# Patient Record
Sex: Male | Born: 1955 | Race: Black or African American | Hispanic: No | Smoking: Never smoker
Health system: Southern US, Community
[De-identification: ages and names within clinical notes are randomized; demographics above are authoritative.]

---

## 2019-08-24 ENCOUNTER — Inpatient Hospital Stay (HOSPITAL_COMMUNITY)

## 2019-08-24 ENCOUNTER — Inpatient Hospital Stay (HOSPITAL_COMMUNITY)
Admission: AD | Admit: 2019-08-24 | Discharge: 2019-08-29 | DRG: 177 | Disposition: A | Discharge status: Other Acute Inpatient Hospital | Source: Other Acute Inpatient Hospital | Attending: Internal Medicine | Admitting: Internal Medicine

## 2019-08-24 DIAGNOSIS — I252 Old myocardial infarction: Secondary | ICD-10-CM | POA: Diagnosis not present

## 2019-08-24 DIAGNOSIS — G40909 Epilepsy, unspecified, not intractable, without status epilepticus: Secondary | ICD-10-CM | POA: Diagnosis present

## 2019-08-24 DIAGNOSIS — R0602 Shortness of breath: Secondary | ICD-10-CM | POA: Diagnosis not present

## 2019-08-24 DIAGNOSIS — I10 Essential (primary) hypertension: Secondary | ICD-10-CM | POA: Diagnosis present

## 2019-08-24 DIAGNOSIS — I4901 Ventricular fibrillation: Secondary | ICD-10-CM | POA: Diagnosis present

## 2019-08-24 DIAGNOSIS — E1169 Type 2 diabetes mellitus with other specified complication: Secondary | ICD-10-CM

## 2019-08-24 DIAGNOSIS — R509 Fever, unspecified: Secondary | ICD-10-CM | POA: Diagnosis present

## 2019-08-24 DIAGNOSIS — G931 Anoxic brain damage, not elsewhere classified: Secondary | ICD-10-CM | POA: Diagnosis present

## 2019-08-24 DIAGNOSIS — D509 Iron deficiency anemia, unspecified: Secondary | ICD-10-CM | POA: Diagnosis present

## 2019-08-24 DIAGNOSIS — B9689 Other specified bacterial agents as the cause of diseases classified elsewhere: Secondary | ICD-10-CM | POA: Diagnosis present

## 2019-08-24 DIAGNOSIS — F29 Unspecified psychosis not due to a substance or known physiological condition: Secondary | ICD-10-CM | POA: Diagnosis present

## 2019-08-24 DIAGNOSIS — H548 Legal blindness, as defined in USA: Secondary | ICD-10-CM | POA: Diagnosis present

## 2019-08-24 DIAGNOSIS — U071 COVID-19: Secondary | ICD-10-CM | POA: Diagnosis present

## 2019-08-24 DIAGNOSIS — J9601 Acute respiratory failure with hypoxia: Secondary | ICD-10-CM

## 2019-08-24 DIAGNOSIS — Z87440 Personal history of urinary (tract) infections: Secondary | ICD-10-CM | POA: Diagnosis not present

## 2019-08-24 DIAGNOSIS — N179 Acute kidney failure, unspecified: Secondary | ICD-10-CM | POA: Diagnosis present

## 2019-08-24 DIAGNOSIS — E119 Type 2 diabetes mellitus without complications: Secondary | ICD-10-CM

## 2019-08-24 DIAGNOSIS — K567 Ileus, unspecified: Secondary | ICD-10-CM | POA: Diagnosis present

## 2019-08-24 DIAGNOSIS — J9811 Atelectasis: Secondary | ICD-10-CM | POA: Diagnosis present

## 2019-08-24 DIAGNOSIS — B3749 Other urogenital candidiasis: Secondary | ICD-10-CM | POA: Diagnosis present

## 2019-08-24 DIAGNOSIS — Z1612 Extended spectrum beta lactamase (ESBL) resistance: Secondary | ICD-10-CM | POA: Diagnosis present

## 2019-08-24 DIAGNOSIS — Z79899 Other long term (current) drug therapy: Secondary | ICD-10-CM

## 2019-08-24 DIAGNOSIS — Z8673 Personal history of transient ischemic attack (TIA), and cerebral infarction without residual deficits: Secondary | ICD-10-CM

## 2019-08-24 DIAGNOSIS — Z7401 Bed confinement status: Secondary | ICD-10-CM

## 2019-08-24 DIAGNOSIS — I2510 Atherosclerotic heart disease of native coronary artery without angina pectoris: Secondary | ICD-10-CM | POA: Diagnosis present

## 2019-08-24 DIAGNOSIS — Z794 Long term (current) use of insulin: Secondary | ICD-10-CM

## 2019-08-24 LAB — COMPREHENSIVE METABOLIC PANEL
ALT: 20 U/L (ref 0–44)
AST: 55 U/L — ABNORMAL HIGH (ref 15–41)
Albumin: 2.6 g/dL — ABNORMAL LOW (ref 3.5–5.0)
Alkaline Phosphatase: 107 U/L (ref 38–126)
Anion gap: 10 (ref 5–15)
BUN: 28 mg/dL — ABNORMAL HIGH (ref 8–23)
CO2: 23 mmol/L (ref 22–32)
Calcium: 8.5 mg/dL — ABNORMAL LOW (ref 8.9–10.3)
Chloride: 102 mmol/L (ref 98–111)
Creatinine, Ser: 2.38 mg/dL — ABNORMAL HIGH (ref 0.61–1.24)
GFR calc Af Amer: 32 mL/min — ABNORMAL LOW (ref >60)
GFR calc non Af Amer: 28 mL/min — ABNORMAL LOW (ref >60)
Glucose, Bld: 113 mg/dL — ABNORMAL HIGH (ref 70–99)
Potassium: 5 mmol/L (ref 3.5–5.1)
Sodium: 135 mmol/L (ref 135–145)
Total Bilirubin: 0.4 mg/dL (ref 0.3–1.2)
Total Protein: 6 g/dL — ABNORMAL LOW (ref 6.5–8.1)

## 2019-08-24 LAB — BRAIN NATRIURETIC PEPTIDE: B Natriuretic Peptide: 45.2 pg/mL (ref 0.0–100.0)

## 2019-08-24 LAB — CBC WITH DIFFERENTIAL/PLATELET
Abs Immature Granulocytes: 0.04 10*3/uL (ref 0.00–0.07)
Basophils Absolute: 0 10*3/uL (ref 0.0–0.1)
Basophils Relative: 0 %
Eosinophils Absolute: 0 10*3/uL (ref 0.0–0.5)
Eosinophils Relative: 0 %
HCT: 27.2 % — ABNORMAL LOW (ref 39.0–52.0)
Hemoglobin: 8.2 g/dL — ABNORMAL LOW (ref 13.0–17.0)
Immature Granulocytes: 1 %
Lymphocytes Relative: 7 %
Lymphs Abs: 0.6 10*3/uL — ABNORMAL LOW (ref 0.7–4.0)
MCH: 21.5 pg — ABNORMAL LOW (ref 26.0–34.0)
MCHC: 30.1 g/dL (ref 30.0–36.0)
MCV: 71.4 fL — ABNORMAL LOW (ref 80.0–100.0)
Monocytes Absolute: 0.4 10*3/uL (ref 0.1–1.0)
Monocytes Relative: 5 %
Neutro Abs: 7 10*3/uL (ref 1.7–7.7)
Neutrophils Relative %: 87 %
Platelets: 329 10*3/uL (ref 150–400)
RBC: 3.81 MIL/uL — ABNORMAL LOW (ref 4.22–5.81)
RDW: 19.3 % — ABNORMAL HIGH (ref 11.5–15.5)
WBC: 8 10*3/uL (ref 4.0–10.5)
nRBC: 0 % (ref 0.0–0.2)

## 2019-08-24 LAB — HIV ANTIBODY (ROUTINE TESTING W REFLEX): HIV Screen 4th Generation wRfx: NONREACTIVE

## 2019-08-24 LAB — GLUCOSE, CAPILLARY
Glucose-Capillary: 97 mg/dL (ref 70–99)
Glucose-Capillary: 153 mg/dL — ABNORMAL HIGH (ref 70–99)

## 2019-08-24 LAB — HEMOGLOBIN A1C
Hgb A1c MFr Bld: 5.6 % (ref 4.8–5.6)
Mean Plasma Glucose: 114.02 mg/dL

## 2019-08-24 LAB — PHENYTOIN LEVEL, TOTAL: Phenytoin Lvl: 36.3 ug/mL (ref 10.0–20.0)

## 2019-08-24 LAB — PROCALCITONIN: Procalcitonin: 0.99 ng/mL

## 2019-08-24 LAB — ABO/RH: ABO/RH(D): O POS

## 2019-08-24 LAB — C-REACTIVE PROTEIN: CRP: 17.2 mg/dL — ABNORMAL HIGH (ref <1.0)

## 2019-08-24 LAB — D-DIMER, QUANTITATIVE: D-Dimer, Quant: 3.17 ug/mL-FEU — ABNORMAL HIGH (ref 0.00–0.50)

## 2019-08-24 MED ORDER — ONDANSETRON HCL 4 MG/2ML IJ SOLN: 4.0000 mg | Freq: Four times a day (QID) | INTRAMUSCULAR | Status: DC | PRN

## 2019-08-24 MED ORDER — FERROUS GLUCONATE 324 (38 FE) MG PO TABS
324.0000 mg | ORAL_TABLET | Freq: Every day | ORAL | Status: DC
  Administered 2019-08-25 – 2019-08-29 (×5): 324 mg via ORAL
  Filled 2019-08-24 (×8): qty 1

## 2019-08-24 MED ORDER — ISOSORBIDE MONONITRATE ER 30 MG PO TB24: 30.0000 mg | ORAL_TABLET | Freq: Every day | ORAL | Status: DC

## 2019-08-24 MED ORDER — SODIUM CHLORIDE 0.9 % IV SOLN
100.0000 mg | Freq: Every day | INTRAVENOUS | Status: AC
  Administered 2019-08-25 – 2019-08-28 (×4): 100 mg via INTRAVENOUS
  Filled 2019-08-24 (×5): qty 20

## 2019-08-24 MED ORDER — DEXAMETHASONE SODIUM PHOSPHATE 10 MG/ML IJ SOLN
6.0000 mg | Freq: Every day | INTRAMUSCULAR | Status: DC
  Administered 2019-08-24 – 2019-08-29 (×6): 6 mg via INTRAVENOUS
  Filled 2019-08-24 (×6): qty 1

## 2019-08-24 MED ORDER — SODIUM CHLORIDE 0.9 % IV SOLN
200.0000 mg | Freq: Once | INTRAVENOUS | Status: AC
  Administered 2019-08-24: 200 mg via INTRAVENOUS
  Filled 2019-08-24: qty 40

## 2019-08-24 MED ORDER — PANTOPRAZOLE SODIUM 40 MG PO TBEC
40.0000 mg | DELAYED_RELEASE_TABLET | Freq: Every day | ORAL | Status: DC
  Administered 2019-08-25 – 2019-08-29 (×5): 40 mg via ORAL
  Filled 2019-08-24 (×6): qty 1

## 2019-08-24 MED ORDER — ALBUTEROL SULFATE HFA 108 (90 BASE) MCG/ACT IN AERS
2.0000 | INHALATION_SPRAY | Freq: Four times a day (QID) | RESPIRATORY_TRACT | Status: DC
  Administered 2019-08-24 – 2019-08-29 (×20): 2 via RESPIRATORY_TRACT
  Filled 2019-08-24: qty 6.7

## 2019-08-24 MED ORDER — HEPARIN SODIUM (PORCINE) 5000 UNIT/ML IJ SOLN
5000.0000 [IU] | Freq: Three times a day (TID) | INTRAMUSCULAR | Status: DC
  Administered 2019-08-24 – 2019-08-29 (×15): 5000 [IU] via SUBCUTANEOUS
  Filled 2019-08-24 (×15): qty 1

## 2019-08-24 MED ORDER — ZINC SULFATE 220 (50 ZN) MG PO CAPS
220.0000 mg | ORAL_CAPSULE | Freq: Every day | ORAL | Status: DC
  Administered 2019-08-24 – 2019-08-29 (×6): 220 mg via ORAL
  Filled 2019-08-24 (×6): qty 1

## 2019-08-24 MED ORDER — DEXTROSE-NACL 5-0.45 % IV SOLN: INTRAVENOUS | Status: DC

## 2019-08-24 MED ORDER — METOPROLOL TARTRATE 25 MG PO TABS: 25.0000 mg | ORAL_TABLET | Freq: Two times a day (BID) | ORAL | Status: DC

## 2019-08-24 MED ORDER — OLANZAPINE 5 MG PO TABS
5.0000 mg | ORAL_TABLET | Freq: Every day | ORAL | Status: DC
  Administered 2019-08-24 – 2019-08-28 (×5): 5 mg via ORAL
  Filled 2019-08-24 (×6): qty 1

## 2019-08-24 MED ORDER — INSULIN ASPART 100 UNIT/ML ~~LOC~~ SOLN
0.0000 [IU] | Freq: Three times a day (TID) | SUBCUTANEOUS | Status: DC
  Administered 2019-08-25 (×2): 1 [IU] via SUBCUTANEOUS
  Administered 2019-08-27: 2 [IU] via SUBCUTANEOUS
  Administered 2019-08-28 (×2): 1 [IU] via SUBCUTANEOUS

## 2019-08-24 MED ORDER — SODIUM CHLORIDE 0.9 % IV SOLN
1.0000 g | Freq: Two times a day (BID) | INTRAVENOUS | Status: AC
  Administered 2019-08-24 – 2019-08-26 (×5): 1 g via INTRAVENOUS
  Filled 2019-08-24 (×7): qty 1

## 2019-08-24 MED ORDER — HYDROCOD POLST-CPM POLST ER 10-8 MG/5ML PO SUER: 5.0000 mL | Freq: Two times a day (BID) | ORAL | Status: DC | PRN

## 2019-08-24 MED ORDER — ASCORBIC ACID 500 MG PO TABS
500.0000 mg | ORAL_TABLET | Freq: Every day | ORAL | Status: DC
  Administered 2019-08-24 – 2019-08-29 (×6): 500 mg via ORAL
  Filled 2019-08-24 (×6): qty 1

## 2019-08-24 MED ORDER — PHENYTOIN 50 MG PO CHEW
150.0000 mg | CHEWABLE_TABLET | Freq: Three times a day (TID) | ORAL | Status: DC
  Administered 2019-08-24: 150 mg via ORAL
  Filled 2019-08-24 (×4): qty 3

## 2019-08-24 MED ORDER — ONDANSETRON HCL 4 MG PO TABS: 4.0000 mg | ORAL_TABLET | Freq: Four times a day (QID) | ORAL | Status: DC | PRN

## 2019-08-24 MED ORDER — METOPROLOL TARTRATE 25 MG PO TABS
12.5000 mg | ORAL_TABLET | Freq: Two times a day (BID) | ORAL | Status: DC
  Administered 2019-08-25 (×2): 12.5 mg via ORAL
  Administered 2019-08-26: 25 mg via ORAL
  Administered 2019-08-26 – 2019-08-29 (×6): 12.5 mg via ORAL
  Filled 2019-08-24 (×9): qty 1

## 2019-08-24 MED ORDER — GUAIFENESIN-DM 100-10 MG/5ML PO SYRP: 10.0000 mL | ORAL_SOLUTION | ORAL | Status: DC | PRN

## 2019-08-24 NOTE — Progress Notes (Signed)
MEDICATION RELATED CONSULT NOTE  Pharmacy Consult for Remdesivir Indication: Treatment of COVID-19  No Known Allergies  Labs: CMP ordered CBC with Differential ordered  Microbiology: No results found for this or any previous visit (from the past 720 hour(s)).  Assessment: Patient transferred from [facility] to [facility] due to COVID-19 infection.  Called [facility] who stated no remdesivir doses have been administered yet.  Plan:  Initiate remdesivir 200 mg IV x 1 then 100 mg IV daily x 4 days Monitor hepatic function, renal function, signs/symptoms of infusion reaction.  Royce Macadamia, PharmD, BCPS 08/24/2019,4:05 PM

## 2019-08-24 NOTE — H&P (Signed)
History and Physical    Ricky Olsen RFF:[id number] DOB: [birth year] DOA: 08/24/2019  I have briefly reviewed the patient's prior medical records in [facility]  PCP: No primary care provider on file.  Patient coming from: [facility]  Chief Complaint: Febrile illness, hypoxia  HPI: Ricky Olsen is a 64 y.o. male with medical history significant of type 2 diabetes mellitus, hypertension, apparently had a CVA in 2017 complicated by anoxic encephalopathy and seizure disorder, history of CAD with NSTEMI, history of V. fib, EF 50-55% in 2017, legally blind, who is a current resident of [facility] [place] and in 2017, I presume following CVA he has been living in the inpatient service hospital requiring total care.  In November 2020 due to increased Covid cases in the inpatient service hospital he was transferred for SNF type care to [facility] and has been there since.  His to stay over there is pertinent for anemia requiring a blood transfusion at the end of December, no frank bleeding was noted but his fecal occult was positive.  GI evaluated patient and apparently underwent an EGD on 07/11/2019 without source of bleeding as well as a colonoscopy on 07/13/2019 with no source of bleeding, no polyps, no diverticuli or AV malformations.  Notes do mention at one point to consider capsule study but I do not see a report that this has been done by this time.  On 2/10 patient had a febrile episode and appeared to be septic, he was pancultured and initially started on broad-spectrum antibiotics with vancomycin and meropenem.  He ended up speciate in ESBL Morganella and has been maintained on meropenem with plans in place to be done on 2/20.  On 2/16 patient had an episode of nausea and vomiting and an x-ray of the abdomen showed an ileus.  He was made n.p.o. at that time and placed on IV fluids.  Over the last few days given persistent fever of 101 he was tested for Covid on 2/16 and  this returned positive.  Patient was also noted to be hypoxic on room air and required 2 L nasal cannula, and at that point Dr. Eliezer Champagne asked to transfer patient to [facility] for evaluation for remdesivir.  Given a degree of chronic anoxic encephalopathy patient's baseline is relatively poor, he is bedbound and alert to self and total care.  On my evaluation he can tell me his full name but does not know where he is does not know the time or why he is here.  He currently has no complaints for me, specifically says no to chest pain, abdominal pain, nausea, vomiting, shortness of breath.  His labs prior to transfer showed a hemoglobin of 8.6 (close to his recent baseline), creatinine of 2.1 with prior value at 1.2, LFTs unremarkable and a WBC of 5.6  Review of Systems: Unable to obtain full and accurate review of systems due to underlying mental status  Past medical history Type 2 diabetes mellitus Anoxic encephalopathy Essential hypertension History of CVA Legally blind History of psychosis CAD with prior NSTEMI  Unable to obtain surgical history due to mental status  Unable to obtain social history due to mental status  Based on chart review from the prison system he has no known allergies  Unable to obtain family history due to mental status  Prior to Admission medications   Not on File  Based on chart review Imdur 10 mg every 8 hours Metoprolol 25 mg every 12 Lisinopril  5 mg daily Sliding scale insulin Metformin 1000 mg twice daily Phenytoin 150 mg every 8 Olanzapine 5 mg nightly Iron supplements, multivitamins, as needed laxatives, PPI  Physical Exam: There were no vitals filed for this visit.  Constitutional: NAD, calm, comfortable ENMT: Mucous membranes are moist. Posterior pharynx clear of any exudate or lesions.Normal dentition.  Neck: normal, supple Respiratory: clear to auscultation bilaterally, no wheezing, no crackles. Normal respiratory effort. No  accessory muscle use.  Diminished at the bases Cardiovascular: Regular rate and rhythm, no murmurs / rubs / gallops. No extremity edema. 2+ pedal pulses.  Abdomen: no tenderness, no masses palpated. Bowel sounds positive.  Musculoskeletal: no clubbing / cyanosis. Normal muscle tone.  Neurologic: Contracted upper extremities, strength appears equal but does not follow commands consistently Psychiatric: Alert to person only Labs on Admission: I have personally reviewed labs at [place] on the paper chart, briefly mentioned above  CBC: No results for input(s): WBC, NEUTROABS, HGB, HCT, MCV, PLT in the last 168 hours. Basic Metabolic Panel: No results for input(s): NA, K, CL, CO2, GLUCOSE, BUN, CREATININE, CALCIUM, MG, PHOS in the last 168 hours. Liver Function Tests: No results for input(s): AST, ALT, ALKPHOS, BILITOT, PROT, ALBUMIN in the last 168 hours. Coagulation Profile: No results for input(s): INR, PROTIME in the last 168 hours. BNP (last 3 results) No results for input(s): PROBNP in the last 8760 hours. CBG: No results for input(s): GLUCAP in the last 168 hours. Thyroid Function Tests: No results for input(s): TSH, T4TOTAL, FREET4, T3FREE, THYROIDAB in the last 72 hours. Urine analysis: No results found for: COLORURINE, APPEARANCEUR, LABSPEC, PHURINE, GLUCOSEU, HGBUR, BILIRUBINUR, KETONESUR, PROTEINUR, UROBILINOGEN, NITRITE, LEUKOCYTESUR   Radiological Exams on Admission: No results found.  EKG: Independently reviewed.  Pending  Assessment/Plan  Principal Problem Acute hypoxic respiratory failure possibly due to COVID-19 -Patient had a febrile illness over the last week, despite antibiotic coverage for his ESBL UTI and was tested for COVID-19 on 2/16 and ended up being positive.  Chest x-ray showed some bibasilar atelectasis, however regardless he became slightly more hypoxic requiring supplemental oxygen and was transferred to [facility] patient on remdesivir along  with oxygen. -Obtain inflammatory markers and monitor  Active Problems Essential hypertension -Initial blood pressure on arrival here is soft with 92J systolic.  Hold home medications except for metoprolol at a lower dose of 12.5 twice daily  Seizure disorder -Resume home phenytoin, will check a phenytoin level.  Per chart review, initially he was admitted at the [facility] on 50 mg q. 8 however his dose was adjusted following phenytoin levels  History of CVA -I do not see him being on aspirin, perhaps this was held when there was concern for GI bleed  Acute kidney injury -Creatinine today at [facility] was 2.1, possibly dehydrated given reports of vomiting/ileus/n.p.o. status but was on fluids.  We will continue fluids here, hold home lisinopril and recheck renal function in the morning\  Type 2 diabetes mellitus -Hold oral Metformin, placed on sliding scale  Iron deficiency anemia -There was concern for blood loss anemia from GI source given positive fecal occult and required a blood transfusion in December, however EGD and colonoscopy work-up were negative.  There were mentions about a capsule study but that does not appear to have been done.  Will monitor blood counts here -Continue iron supplementations  Anoxic brain injury/intermittent psychosis -Per notes, continue Zyprexa nightly  Coronary artery disease -Hold Imdur given hypotension  DVT prophylaxis: heparin  Code Status: Full code  per documentations  Family Communication: none Disposition Plan: Discussed with Dr. Eliezer Champagne, he confirmed that patient will be accepted back at [facility] as soon as he is medically stable from Covid standpoint, even if he is still in the window of needing isolation Bed Type: telemetry  Consults called: none  Obs/Inp: inpatient   Pamella Pert, MD, PhD [facility]  Contact via [email or web address]  08/24/2019, 3:21 PM

## 2019-08-24 NOTE — Progress Notes (Signed)
Pharmacy Antibiotic Note  Ricky Olsen is a 64 y.o. male admitted to [facility] from [facility] on 08/24/2019 with COVID-19 pneumonia and ESBL Morganella UTI. Patient has been receiving Meropenem at [facility]. Pharmacy has been consulted to continue Meropenem dosing until 08/26/19. Of note, SCr at outside facility was 2.1.   Plan: -Merepenem 1 gm IV Q 12 hours. Stop date 08/26/19 per MD  -Monitor renal fx, CBC and clinical progress   No data recorded.  No results for input(s): WBC, CREATININE, LATICACIDVEN, VANCOTROUGH, VANCOPEAK, VANCORANDOM, GENTTROUGH, GENTPEAK, GENTRANDOM, TOBRATROUGH, TOBRAPEAK, TOBRARND, AMIKACINPEAK, AMIKACINTROU, AMIKACIN in the last 168 hours.  CrCl cannot be calculated (No successful lab value found.).    No Known Allergies    Thank you for allowing pharmacy to be a part of this patient's care.  Vinnie Level, PharmD., BCPS Clinical Pharmacist Clinical phone for 08/24/19 until 5pm: [phone]

## 2019-08-24 NOTE — Progress Notes (Addendum)
MEDICATION RELATED CONSULT NOTE - INITIAL   Pharmacy Consult for Dilantin Indication: seizure  No Known Allergies  Patient Measurements:   Adjusted Body Weight:   Vital Signs: Temp: 98.3 F (36.8 C) (02/18 1953) Temp Source: Oral (02/18 1953) BP: 95/70 (02/18 1953) Pulse Rate: 86 (02/18 1953) Intake/Output from previous day: No intake/output data recorded. Intake/Output from this shift: No intake/output data recorded.  Labs: Recent Labs    08/24/19 1621  WBC 8.0  HGB 8.2*  HCT 27.2*  PLT 329  CREATININE 2.38*  ALBUMIN 2.6*  PROT 6.0*  AST 55*  ALT 20  ALKPHOS 107  BILITOT 0.4   CrCl cannot be calculated (Unknown ideal weight.).   Microbiology: No results found for this or any previous visit (from the past 720 hour(s)).  Medical History: No past medical history on file.  Medications:  Medications Prior to Admission  Medication Sig Dispense Refill Last Dose  . ferrous sulfate 325 (65 FE) MG tablet Take 325 mg by mouth daily with breakfast.     . isosorbide dinitrate (ISORDIL) 10 MG tablet Take 10 mg by mouth 3 (three) times daily.     Marland Kitchen lisinopril (ZESTRIL) 5 MG tablet Take 5 mg by mouth daily.     . metFORMIN (GLUCOPHAGE) 1000 MG tablet Take 1,000 mg by mouth in the morning and at bedtime.     . metoprolol tartrate (LOPRESSOR) 25 MG tablet Take 25 mg by mouth 2 (two) times daily.     Marland Kitchen OLANZapine (ZYPREXA) 5 MG tablet Take 5 mg by mouth at bedtime.      . phenytoin (DILANTIN) 50 MG tablet Chew 150 mg by mouth 3 (three) times daily.      Scheduled:  . albuterol  2 puff Inhalation Q6H  . vitamin C  500 mg Oral Daily  . dexamethasone (DECADRON) injection  6 mg Intravenous Daily  . [START ON 08/25/2019] ferrous gluconate  324 mg Oral Q breakfast  . heparin  5,000 Units Subcutaneous Q8H  . insulin aspart  0-9 Units Subcutaneous TID WC  . metoprolol tartrate  12.5 mg Oral BID  . OLANZapine  5 mg Oral QHS  . [START ON 08/25/2019] pantoprazole  40 mg Oral Daily   . phenytoin  150 mg Oral TID  . zinc sulfate  220 mg Oral Daily    Assessment: Pt was tx from [facility] earlier today for COVID. He has been on phenytoin outpt for seizure with a relatively high dose. Level came back at 36.3 tonight>>corrected 44.2 due to low albumin. We will hold phenytoin and check daily level. Resume when corrected level is in range. He will likely need a much lower maintenance dose.   Goal of Therapy:  Phenytoin: 10-20  Plan:   Hold phenytoin Daily phenytoin level   Ulyses Southward, PharmD, [place], AAHIVP, CPP Infectious Disease Pharmacist 08/24/2019 8:48 PM

## 2019-08-24 NOTE — Progress Notes (Signed)
Lab called and reported patient had an increase in Dilantin of 36.3. Nurse received orders from Dwana Curd, MD to hold night time dose.

## 2019-08-25 ENCOUNTER — Inpatient Hospital Stay (HOSPITAL_COMMUNITY)

## 2019-08-25 DIAGNOSIS — N179 Acute kidney failure, unspecified: Secondary | ICD-10-CM

## 2019-08-25 DIAGNOSIS — R0602 Shortness of breath: Secondary | ICD-10-CM

## 2019-08-25 LAB — C-REACTIVE PROTEIN: CRP: 18.1 mg/dL — ABNORMAL HIGH (ref <1.0)

## 2019-08-25 LAB — GLUCOSE, CAPILLARY
Glucose-Capillary: 98 mg/dL (ref 70–99)
Glucose-Capillary: 146 mg/dL — ABNORMAL HIGH (ref 70–99)
Glucose-Capillary: 130 mg/dL — ABNORMAL HIGH (ref 70–99)

## 2019-08-25 LAB — URINALYSIS, ROUTINE W REFLEX MICROSCOPIC
Bilirubin Urine: NEGATIVE
Glucose, UA: 50 mg/dL — AB
Hgb urine dipstick: NEGATIVE
Ketones, ur: 5 mg/dL — AB
Nitrite: NEGATIVE
Protein, ur: 100 mg/dL — AB
Specific Gravity, Urine: 1.014 (ref 1.005–1.030)
pH: 6 (ref 5.0–8.0)

## 2019-08-25 LAB — CBC WITH DIFFERENTIAL/PLATELET
Abs Immature Granulocytes: 0.04 10*3/uL (ref 0.00–0.07)
Basophils Absolute: 0 10*3/uL (ref 0.0–0.1)
Basophils Relative: 0 %
Eosinophils Absolute: 0 10*3/uL (ref 0.0–0.5)
Eosinophils Relative: 0 %
HCT: 28.7 % — ABNORMAL LOW (ref 39.0–52.0)
Hemoglobin: 8.7 g/dL — ABNORMAL LOW (ref 13.0–17.0)
Immature Granulocytes: 1 %
Lymphocytes Relative: 18 %
Lymphs Abs: 1.3 10*3/uL (ref 0.7–4.0)
MCH: 21.6 pg — ABNORMAL LOW (ref 26.0–34.0)
MCHC: 30.3 g/dL (ref 30.0–36.0)
MCV: 71.4 fL — ABNORMAL LOW (ref 80.0–100.0)
Monocytes Absolute: 0.8 10*3/uL (ref 0.1–1.0)
Monocytes Relative: 12 %
Neutro Abs: 4.9 10*3/uL (ref 1.7–7.7)
Neutrophils Relative %: 69 %
Platelets: 296 10*3/uL (ref 150–400)
RBC: 4.02 MIL/uL — ABNORMAL LOW (ref 4.22–5.81)
RDW: 19.3 % — ABNORMAL HIGH (ref 11.5–15.5)
WBC: 7.1 10*3/uL (ref 4.0–10.5)
nRBC: 0 % (ref 0.0–0.2)

## 2019-08-25 LAB — COMPREHENSIVE METABOLIC PANEL
ALT: 19 U/L (ref 0–44)
AST: 58 U/L — ABNORMAL HIGH (ref 15–41)
Albumin: 2.5 g/dL — ABNORMAL LOW (ref 3.5–5.0)
Alkaline Phosphatase: 105 U/L (ref 38–126)
Anion gap: 11 (ref 5–15)
BUN: 31 mg/dL — ABNORMAL HIGH (ref 8–23)
CO2: 21 mmol/L — ABNORMAL LOW (ref 22–32)
Calcium: 8.4 mg/dL — ABNORMAL LOW (ref 8.9–10.3)
Chloride: 105 mmol/L (ref 98–111)
Creatinine, Ser: 2.5 mg/dL — ABNORMAL HIGH (ref 0.61–1.24)
GFR calc Af Amer: 31 mL/min — ABNORMAL LOW (ref >60)
GFR calc non Af Amer: 26 mL/min — ABNORMAL LOW (ref >60)
Glucose, Bld: 100 mg/dL — ABNORMAL HIGH (ref 70–99)
Potassium: 4.6 mmol/L (ref 3.5–5.1)
Sodium: 137 mmol/L (ref 135–145)
Total Bilirubin: 0.7 mg/dL (ref 0.3–1.2)
Total Protein: 6 g/dL — ABNORMAL LOW (ref 6.5–8.1)

## 2019-08-25 LAB — D-DIMER, QUANTITATIVE: D-Dimer, Quant: 2.21 ug/mL-FEU — ABNORMAL HIGH (ref 0.00–0.50)

## 2019-08-25 LAB — PHOSPHORUS: Phosphorus: 2.9 mg/dL (ref 2.5–4.6)

## 2019-08-25 LAB — PHENYTOIN LEVEL, TOTAL: Phenytoin Lvl: 29.9 ug/mL — ABNORMAL HIGH (ref 10.0–20.0)

## 2019-08-25 LAB — MAGNESIUM: Magnesium: 1.6 mg/dL — ABNORMAL LOW (ref 1.7–2.4)

## 2019-08-25 MED ORDER — POLYETHYLENE GLYCOL 3350 17 G PO PACK
17.0000 g | PACK | Freq: Two times a day (BID) | ORAL | Status: DC
  Administered 2019-08-25 – 2019-08-29 (×8): 17 g via ORAL
  Filled 2019-08-25 (×8): qty 1

## 2019-08-25 MED ORDER — SENNOSIDES-DOCUSATE SODIUM 8.6-50 MG PO TABS
1.0000 | ORAL_TABLET | Freq: Two times a day (BID) | ORAL | Status: DC
  Administered 2019-08-25 – 2019-08-29 (×8): 1 via ORAL
  Filled 2019-08-25 (×8): qty 1

## 2019-08-25 MED ORDER — MAGNESIUM SULFATE 2 GM/50ML IV SOLN
2.0000 g | Freq: Once | INTRAVENOUS | Status: AC
  Administered 2019-08-25: 2 g via INTRAVENOUS
  Filled 2019-08-25: qty 50

## 2019-08-25 MED ORDER — ACETAMINOPHEN 325 MG PO TABS: 650.0000 mg | ORAL_TABLET | Freq: Four times a day (QID) | ORAL | Status: DC | PRN

## 2019-08-25 NOTE — Progress Notes (Signed)
PROGRESS NOTE  Ricky Olsen ION:[id number] DOB: 09/22/1955 DOA: 08/24/2019 PCP: System, Pcp Not In   LOS: 1 day   Brief Narrative / Interim history: 64 year old male with DM 2, HTN, prior CVA, chronic anoxic encephalopathy, seizure disorder, CAD, legally blind, currently in prison and long-term hospital care due to total assist and bedbound status, was transferred from the prison hospital to [place] in November 2020 for SNF type care, contracted Covid while at [facility], had hypoxia and was transferred to [facility] on 2/18  Subjective / 24h Interval events: -He has no complaints for me this morning, alert to person only, denies any chest pain, denies any shortness of breath  Assessment & Plan:  Principal Problem Acute Hypoxic Respiratory Failure due to Covid-19 Viral Illness -Requiring 2 L on admission but this morning appears to be stable on room air -Continue remdesivir, steroids, monitor inflammatory markers his CRP is quite elevated.  No indication for Actemra given no hypoxia today -Chest x-ray done this admission with faint bibasilar opacities, possibly atelectasis  COVID-19 Labs  Recent Labs    08/24/19 1621 08/25/19 0157  DDIMER 3.17* 2.21*  CRP 17.2* 18.1*    Active Problems Essential hypertension -Blood pressure ranging between 24M to 010 systolic, continue to hold home lisinopril and keep on metoprolol at a lower dose  Morganella ESBL UTI -Diagnosed prior to admission, currently on meropenem which is scheduled to be done on 2/20  Acute kidney injury -Creatinine slightly worsened this morning at 2.5, possibly dehydration plays a role given vomiting/ileus/n.p.o. prior to admission, however not improving despite being on fluids overnight -Bladder scan, renal ultrasound  Ileus -Patient had an episode of vomiting on 2/16, was diagnosed with an ileus at that time.  Abdominal x-ray on admission showed air-filled loops of large and small bowel suggesting  ileus -Patient is asymptomatic, no abdominal pain, no nausea or vomiting.  Start clears, aggressive bowel management  Hypomagnesemia -Replete  Seizure disorder -On arrival to [facility] in 2020 he was on 50 mg every 8 of phenytoin however on discharge he was on 150 every 8 probably related to low phenytoin levels -Phenytoin is actually quite elevated here, pharmacy consulted  History of CVA -I do not see him being on aspirin, perhaps this was held when there was concern for GI bleed.  Defer to outpatient  Type 2 diabetes mellitus -Hold oral Metformin, placed on sliding scale CBG (last 3)  Recent Labs    08/24/19 1528 08/24/19 2028 08/25/19 0813  GLUCAP 97 153* 98   Iron deficiency anemia -There was concern for blood loss anemia from GI source given positive fecal occult and required a blood transfusion in December, however EGD and colonoscopy work-up were negative.  There were mentions about a capsule study but that does not appear to have been done.  Will monitor blood counts here -Continue iron supplementations -Hemoglobin remained stable  Anoxic brain injury/intermittent psychosis -Per notes, continue Zyprexa nightly  Coronary artery disease -Resume Imdur today  Scheduled Meds: . albuterol  2 puff Inhalation Q6H  . vitamin C  500 mg Oral Daily  . dexamethasone (DECADRON) injection  6 mg Intravenous Daily  . ferrous gluconate  324 mg Oral Q breakfast  . heparin  5,000 Units Subcutaneous Q8H  . insulin aspart  0-9 Units Subcutaneous TID WC  . metoprolol tartrate  12.5 mg Oral BID  . OLANZapine  5 mg Oral QHS  . pantoprazole  40 mg Oral Daily  . polyethylene glycol  17 g Oral BID  .  senna-docusate  1 tablet Oral BID  . zinc sulfate  220 mg Oral Daily   Continuous Infusions: . dextrose 5 % and 0.45% NaCl 125 mL/hr at 08/25/19 0642  . meropenem (MERREM) IV 1 g (08/25/19 0947)  . remdesivir 100 mg in NS 100 mL 100 mg (08/25/19 0854)   PRN Meds:.acetaminophen,  chlorpheniramine-HYDROcodone, guaiFENesin-dextromethorphan, ondansetron **OR** ondansetron (ZOFRAN) IV  DVT prophylaxis: Lovenox Code Status: Full code Family Communication: No family present Patient admitted from: [facility] Anticipated d/c place: [facility] Barriers to d/c: Acute medical work-up, IV remdesivir which is not available there  Consultants:  None   Procedures:  None   Microbiology: None   Antibacterials: Meropenem << 2/20   Objective: Vitals:   08/24/19 2300 08/25/19 0000 08/25/19 0400 08/25/19 0600  BP:  91/64 96/70   Pulse: 87 86 96 92  Resp: 20 20 20 16   Temp:  97.9 F (36.6 C) 98.1 F (36.7 C)   TempSrc:  Axillary Axillary   SpO2: 98% 95% 100% 90%  Weight:      Height:        Intake/Output Summary (Last 24 hours) at 08/25/2019 1115 Last data filed at 08/25/2019 0600 Gross per 24 hour  Intake --  Output 250 ml  Net -250 ml   Filed Weights   08/24/19 2000  Weight: 83.5 kg    Examination:  Constitutional: NAD Eyes: no scleral icterus ENMT: Mucous membranes are moist.  Neck: normal, supple Respiratory: clear to auscultation bilaterally, no wheezing, no crackles.  Tachypneic Cardiovascular: Regular rate and rhythm, no murmurs / rubs / gallops. No LE edema. Good peripheral pulses Abdomen: non distended, no tenderness. Bowel sounds diminished.  Musculoskeletal: no clubbing / cyanosis.  Skin: no rashes Neurologic: Contracted upper extremities, does not follow commands consistently but moves all 4 extremities.   Data Reviewed: I have independently reviewed following labs and imaging studies   CBC: Recent Labs  Lab 08/24/19 1621 08/25/19 0157  WBC 8.0 7.1  NEUTROABS 7.0 4.9  HGB 8.2* 8.7*  HCT 27.2* 28.7*  MCV 71.4* 71.4*  PLT 329 296   Basic Metabolic Panel: Recent Labs  Lab 08/24/19 1621 08/25/19 0157  NA 135 137  K 5.0 4.6  CL 102 105  CO2 23 21*  GLUCOSE 113* 100*  BUN 28* 31*  CREATININE 2.38* 2.50*  CALCIUM 8.5*  8.4*  MG  --  1.6*  PHOS  --  2.9   GFR: Estimated Creatinine Clearance: 32.2 mL/min (A) (by C-G formula based on SCr of 2.5 mg/dL (H)). Liver Function Tests: Recent Labs  Lab 08/24/19 1621 08/25/19 0157  AST 55* 58*  ALT 20 19  ALKPHOS 107 105  BILITOT 0.4 0.7  PROT 6.0* 6.0*  ALBUMIN 2.6* 2.5*   No results for input(s): LIPASE, AMYLASE in the last 168 hours. No results for input(s): AMMONIA in the last 168 hours. Coagulation Profile: No results for input(s): INR, PROTIME in the last 168 hours. Cardiac Enzymes: No results for input(s): CKTOTAL, CKMB, CKMBINDEX, TROPONINI in the last 168 hours. BNP (last 3 results) No results for input(s): PROBNP in the last 8760 hours. HbA1C: Recent Labs    08/24/19 1621  HGBA1C 5.6   CBG: Recent Labs  Lab 08/24/19 1528 08/24/19 2028 08/25/19 0813  GLUCAP 97 153* 98   Lipid Profile: No results for input(s): CHOL, HDL, LDLCALC, TRIG, CHOLHDL, LDLDIRECT in the last 72 hours. Thyroid Function Tests: No results for input(s): TSH, T4TOTAL, FREET4, T3FREE, THYROIDAB in the last  72 hours. Anemia Panel: No results for input(s): VITAMINB12, FOLATE, FERRITIN, TIBC, IRON, RETICCTPCT in the last 72 hours. Urine analysis: No results found for: COLORURINE, APPEARANCEUR, LABSPEC, PHURINE, GLUCOSEU, HGBUR, BILIRUBINUR, KETONESUR, PROTEINUR, UROBILINOGEN, NITRITE, LEUKOCYTESUR Sepsis Labs: Invalid input(s): PROCALCITONIN, LACTICIDVEN  No results found for this or any previous visit (from the past 240 hour(s)).    Radiology Studies: DG Abd 1 View  Result Date: 08/24/2019 CLINICAL DATA:  Limited mobility. EXAM: ABDOMEN - 1 VIEW COMPARISON:  None. FINDINGS: Multiple air-filled loops of large and small bowel are identified. Findings suggest ileus. No convincing evidence of obstruction. No free air, portal venous gas, or pneumatosis. IMPRESSION: Multiple prominent air-filled loops of large and small bowel suggesting the possibility of ileus. No  evidence of obstruction. Electronically Signed   By: Gerome Sam III M.D   On: 08/24/2019 19:47   Portable chest 1 View  Result Date: 08/24/2019 CLINICAL DATA:  COVID-19 EXAM: PORTABLE CHEST 1 VIEW COMPARISON:  04/20/2019 FINDINGS: Shallow lung inflation with faint bibasilar opacity, likely atelectasis. No pleural effusion or pneumothorax. Normal cardiomediastinal contours. IMPRESSION: Shallow lung inflation with faint bibasilar opacities, likely atelectasis. Electronically Signed   By: Deatra Robinson M.D.   On: 08/24/2019 19:16   Pamella Pert, MD, PhD [facility]  Between 7 am - 7 pm I am available, please contact me via Amion or Securechat  Between 7 pm - 7 am I am not available, please contact night coverage MD/APP via Amion

## 2019-08-25 NOTE — Progress Notes (Signed)
MEDICATION RELATED CONSULT NOTE - Follow-Up   Pharmacy Consult for Dilantin Indication: History of seizures  No Known Allergies  Patient Measurements: Height: 5\' 11"  (180.3 cm) Weight: 184 lb (83.5 kg) IBW/kg (Calculated) : 75.3 Adjusted Body Weight:   Vital Signs: Temp: 98.1 F (36.7 C) (02/19 0400) Temp Source: Axillary (02/19 0400) BP: 96/70 (02/19 0400) Pulse Rate: 92 (02/19 0600) Intake/Output from previous day: 02/18 0701 - 02/19 0700 In: -  Out: 250 [Urine:250] Intake/Output from this shift: No intake/output data recorded.  Labs: Recent Labs    08/24/19 1621 08/25/19 0157  WBC 8.0 7.1  HGB 8.2* 8.7*  HCT 27.2* 28.7*  PLT 329 296  CREATININE 2.38* 2.50*  MG  --  1.6*  PHOS  --  2.9  ALBUMIN 2.6* 2.5*  PROT 6.0* 6.0*  AST 55* 58*  ALT 20 19  ALKPHOS 107 105  BILITOT 0.4 0.7   Estimated Creatinine Clearance: 32.2 mL/min (A) (by C-G formula based on SCr of 2.5 mg/dL (H)).   Microbiology: No results found for this or any previous visit (from the past 720 hour(s)).  Medical History: No past medical history on file.  Medications:  Medications Prior to Admission  Medication Sig Dispense Refill Last Dose  . ferrous sulfate 325 (65 FE) MG tablet Take 325 mg by mouth daily with breakfast.     . isosorbide dinitrate (ISORDIL) 10 MG tablet Take 10 mg by mouth 3 (three) times daily.     Marland Kitchen lisinopril (ZESTRIL) 5 MG tablet Take 5 mg by mouth daily.     . metFORMIN (GLUCOPHAGE) 1000 MG tablet Take 1,000 mg by mouth in the morning and at bedtime.     . metoprolol tartrate (LOPRESSOR) 25 MG tablet Take 25 mg by mouth 2 (two) times daily.     Marland Kitchen OLANZapine (ZYPREXA) 5 MG tablet Take 5 mg by mouth at bedtime.      . phenytoin (DILANTIN) 50 MG tablet Chew 150 mg by mouth 3 (three) times daily.      Scheduled:  . albuterol  2 puff Inhalation Q6H  . vitamin C  500 mg Oral Daily  . dexamethasone (DECADRON) injection  6 mg Intravenous Daily  . ferrous gluconate  324  mg Oral Q breakfast  . heparin  5,000 Units Subcutaneous Q8H  . insulin aspart  0-9 Units Subcutaneous TID WC  . metoprolol tartrate  12.5 mg Oral BID  . OLANZapine  5 mg Oral QHS  . pantoprazole  40 mg Oral Daily  . polyethylene glycol  17 g Oral BID  . senna-docusate  1 tablet Oral BID  . zinc sulfate  220 mg Oral Daily    Assessment: Pt was tx from [facility] earlier today for COVID. He has been on phenytoin outpt for seizure with a relatively high dose with levels noted to be supratherapeutic on admission. Phenytoin is now held and trending levels for plans to resume.   The patient's corrected phenytoin level today remains SUPRAtherapeutic (measured 29.9, alb 2.5 >> corrects to 38). Will continue to hold doses and upon resuming will start at a lower dose.   Goal of Therapy:  Corrected Phenytoin level of 10-20 mcg/ml  Plan:  Continue to hold phenytoin Trend daily phenytoin levels + alb for correction  Thank you for allowing pharmacy to be a part of this patient's care.  Alycia Rossetti, PharmD, BCPS Clinical Pharmacist 08/25/2019 9:52 AM   **Pharmacist phone directory can now be found on [email or web address] ([email or web address]).  Listed  under [facility].

## 2019-08-26 ENCOUNTER — Other Ambulatory Visit: Payer: Self-pay

## 2019-08-26 ENCOUNTER — Inpatient Hospital Stay (HOSPITAL_COMMUNITY)

## 2019-08-26 ENCOUNTER — Encounter (HOSPITAL_COMMUNITY): Payer: Self-pay | Admitting: Internal Medicine

## 2019-08-26 LAB — LIPASE, BLOOD: Lipase: 43 U/L (ref 11–51)

## 2019-08-26 LAB — D-DIMER, QUANTITATIVE: D-Dimer, Quant: 1.49 ug/mL-FEU — ABNORMAL HIGH (ref 0.00–0.50)

## 2019-08-26 LAB — CBC WITH DIFFERENTIAL/PLATELET
Abs Immature Granulocytes: 0.02 10*3/uL (ref 0.00–0.07)
Basophils Absolute: 0 10*3/uL (ref 0.0–0.1)
Basophils Relative: 0 %
Eosinophils Absolute: 0 10*3/uL (ref 0.0–0.5)
Eosinophils Relative: 1 %
HCT: 25.5 % — ABNORMAL LOW (ref 39.0–52.0)
Hemoglobin: 7.9 g/dL — ABNORMAL LOW (ref 13.0–17.0)
Immature Granulocytes: 0 %
Lymphocytes Relative: 16 %
Lymphs Abs: 1 10*3/uL (ref 0.7–4.0)
MCH: 21.3 pg — ABNORMAL LOW (ref 26.0–34.0)
MCHC: 31 g/dL (ref 30.0–36.0)
MCV: 68.7 fL — ABNORMAL LOW (ref 80.0–100.0)
Monocytes Absolute: 0.6 10*3/uL (ref 0.1–1.0)
Monocytes Relative: 10 %
Neutro Abs: 4.5 10*3/uL (ref 1.7–7.7)
Neutrophils Relative %: 73 %
Platelets: 342 10*3/uL (ref 150–400)
RBC: 3.71 MIL/uL — ABNORMAL LOW (ref 4.22–5.81)
RDW: 19.2 % — ABNORMAL HIGH (ref 11.5–15.5)
WBC: 6.2 10*3/uL (ref 4.0–10.5)
nRBC: 0 % (ref 0.0–0.2)

## 2019-08-26 LAB — GLUCOSE, CAPILLARY
Glucose-Capillary: 112 mg/dL — ABNORMAL HIGH (ref 70–99)
Glucose-Capillary: 105 mg/dL — ABNORMAL HIGH (ref 70–99)
Glucose-Capillary: 129 mg/dL — ABNORMAL HIGH (ref 70–99)
Glucose-Capillary: 143 mg/dL — ABNORMAL HIGH (ref 70–99)
Glucose-Capillary: 123 mg/dL — ABNORMAL HIGH (ref 70–99)

## 2019-08-26 LAB — COMPREHENSIVE METABOLIC PANEL
ALT: 17 U/L (ref 0–44)
AST: 55 U/L — ABNORMAL HIGH (ref 15–41)
Albumin: 2.4 g/dL — ABNORMAL LOW (ref 3.5–5.0)
Alkaline Phosphatase: 107 U/L (ref 38–126)
Anion gap: 10 (ref 5–15)
BUN: 35 mg/dL — ABNORMAL HIGH (ref 8–23)
CO2: 22 mmol/L (ref 22–32)
Calcium: 8.1 mg/dL — ABNORMAL LOW (ref 8.9–10.3)
Chloride: 105 mmol/L (ref 98–111)
Creatinine, Ser: 2.79 mg/dL — ABNORMAL HIGH (ref 0.61–1.24)
GFR calc Af Amer: 27 mL/min — ABNORMAL LOW (ref >60)
GFR calc non Af Amer: 23 mL/min — ABNORMAL LOW (ref >60)
Glucose, Bld: 100 mg/dL — ABNORMAL HIGH (ref 70–99)
Potassium: 4 mmol/L (ref 3.5–5.1)
Sodium: 137 mmol/L (ref 135–145)
Total Bilirubin: 0.3 mg/dL (ref 0.3–1.2)
Total Protein: 5.9 g/dL — ABNORMAL LOW (ref 6.5–8.1)

## 2019-08-26 LAB — PHENYTOIN LEVEL, TOTAL: Phenytoin Lvl: 28.8 ug/mL — ABNORMAL HIGH (ref 10.0–20.0)

## 2019-08-26 LAB — MAGNESIUM: Magnesium: 2.1 mg/dL (ref 1.7–2.4)

## 2019-08-26 LAB — PHOSPHORUS: Phosphorus: 1.4 mg/dL — ABNORMAL LOW (ref 2.5–4.6)

## 2019-08-26 LAB — C-REACTIVE PROTEIN: CRP: 13.6 mg/dL — ABNORMAL HIGH (ref <1.0)

## 2019-08-26 MED ORDER — FLUCONAZOLE 100 MG PO TABS
100.0000 mg | ORAL_TABLET | Freq: Every day | ORAL | Status: DC
  Administered 2019-08-26 – 2019-08-29 (×4): 100 mg via ORAL
  Filled 2019-08-26 (×4): qty 1

## 2019-08-26 MED ORDER — MILK AND MOLASSES ENEMA
1.0000 | Freq: Once | RECTAL | Status: AC
  Administered 2019-08-26: 240 mL via RECTAL
  Filled 2019-08-26: qty 240

## 2019-08-26 MED ORDER — SODIUM PHOSPHATES 45 MMOLE/15ML IV SOLN
15.0000 mmol | Freq: Once | INTRAVENOUS | Status: AC
  Administered 2019-08-26: 15 mmol via INTRAVENOUS
  Filled 2019-08-26: qty 5

## 2019-08-26 NOTE — Progress Notes (Signed)
MEDICATION RELATED CONSULT NOTE - Follow-Up   Pharmacy Consult for Dilantin Indication: History of seizures  No Known Allergies  Patient Measurements: Height: 5\' 11"  (180.3 cm) Weight: 184 lb (83.5 kg) IBW/kg (Calculated) : 75.3 Adjusted Body Weight:   Vital Signs: Temp: 99.6 F (37.6 C) (02/20 0739) Temp Source: Oral (02/20 0739) BP: 106/77 (02/20 0739) Pulse Rate: 89 (02/20 0739) Intake/Output from previous day: 02/19 0701 - 02/20 0700 In: 2855.2 [P.O.:600; I.V.:1955.2; IV Piggyback:300] Out: 1800 [Urine:1800] Intake/Output from this shift: No intake/output data recorded.  Labs: Recent Labs    08/24/19 1621 08/25/19 0157 08/26/19 0549  WBC 8.0 7.1 6.2  HGB 8.2* 8.7* 7.9*  HCT 27.2* 28.7* 25.5*  PLT 329 296 342  CREATININE 2.38* 2.50* 2.79*  MG  --  1.6* 2.1  PHOS  --  2.9 1.4*  ALBUMIN 2.6* 2.5* 2.4*  PROT 6.0* 6.0* 5.9*  AST 55* 58* 55*  ALT 20 19 17   ALKPHOS 107 105 107  BILITOT 0.4 0.7 0.3     Ref. Range 08/26/2019 05:49  Phenytoin Lvl Latest Ref Range: 10.0 - 20.0 ug/mL 28.8 (H)    Estimated Creatinine Clearance: 28.9 mL/min (A) (by C-G formula based on SCr of 2.79 mg/dL (H)).  Medications:  Medications Prior to Admission  Medication Sig Dispense Refill Last Dose  . acetaminophen (TYLENOL) 650 MG CR tablet Take 650 mg by mouth every 6 (six) hours as needed for pain.   02/17  . bisacodyl (DULCOLAX) 10 MG suppository Place 10 mg rectally daily as needed for moderate constipation.   02/18  . dexamethasone (DECADRON) 10 MG/ML injection Inject 10 mg into the vein daily.   02/18  . enoxaparin (LOVENOX) 30 MG/0.3ML injection Inject 30 mg into the skin daily.   02/18  . ferrous sulfate 325 (65 FE) MG tablet Take 325 mg by mouth daily with breakfast.   02/18  . insulin lispro (HUMALOG) 100 UNIT/ML injection Inject 0-5 Units into the skin 3 (three) times daily before meals. Sliding Scale Insulin 0-150=0 units, 151-200=1 unit, 201-250=2 units, 251-300=3 units,  301-350=4 units, 351-400=5 units, greater than 400 call MD   02/18  . isosorbide dinitrate (ISORDIL) 10 MG tablet Take 10 mg by mouth 3 (three) times daily.   02/18  . lisinopril (ZESTRIL) 5 MG tablet Take 5 mg by mouth daily.   02/18  . Melatonin 3 MG TABS Take 6 mg by mouth at bedtime as needed (sleep).   02/16  . meropenem 1 g in sodium chloride 0.9 % 100 mL Inject 1 g into the vein every 8 (eight) hours.   02/18  . metFORMIN (GLUCOPHAGE) 1000 MG tablet Take 1,000 mg by mouth 2 (two) times daily with a meal.    02/18  . metoprolol tartrate (LOPRESSOR) 25 MG tablet Take 25 mg by mouth 2 (two) times daily.   2/18  . Multiple Vitamin (MULTIVITAMIN WITH MINERALS) TABS tablet Take 1 tablet by mouth daily.   02/18  . OLANZapine (ZYPREXA) 5 MG tablet Take 5 mg by mouth at bedtime.    02/17  . pantoprazole (PROTONIX) 40 MG tablet Take 40 mg by mouth daily.   08/24/2019  . phenytoin (DILANTIN) 100 MG ER capsule Take 100 mg by mouth 3 (three) times daily. 100mg +50mg =150 mg 3 times daily   02/18  . phenytoin (DILANTIN) 50 MG tablet Chew 50 mg by mouth 3 (three) times daily. 100mg +50mg =150 mg 3 times daily   02/18  . polyethylene glycol (MIRALAX / GLYCOLAX) 17  g packet Take 17 g by mouth daily as needed (constipation.).   02/12  . senna (SENOKOT) 8.6 MG TABS tablet Take 2 tablets by mouth daily as needed for mild constipation. HOLD FOR LOOSE STOOLS   02/18  . simethicone (MYLICON) 80 MG chewable tablet Chew 80 mg by mouth every 6 (six) hours as needed (gas/indigestion/heartburn).   07/04/2019   Scheduled:  . albuterol  2 puff Inhalation Q6H  . vitamin C  500 mg Oral Daily  . dexamethasone (DECADRON) injection  6 mg Intravenous Daily  . ferrous gluconate  324 mg Oral Q breakfast  . fluconazole  100 mg Oral Daily  . heparin  5,000 Units Subcutaneous Q8H  . insulin aspart  0-9 Units Subcutaneous TID WC  . metoprolol tartrate  12.5 mg Oral BID  . OLANZapine  5 mg Oral QHS  . pantoprazole  40 mg Oral  Daily  . polyethylene glycol  17 g Oral BID  . senna-docusate  1 tablet Oral BID  . zinc sulfate  220 mg Oral Daily    Assessment: Pt was tx from [facility] earlier today for COVID. He has been on phenytoin outpt for seizure with a relatively high dose with levels noted to be supratherapeutic on admission. Phenytoin is now held and trending levels for plans to resume.   The patient's corrected phenytoin level today remains SUPRAtherapeutic (measured 28.8, alb 2.4 >> corrects to 38). Will continue to hold doses and upon resuming will start at a lower dose.   Goal of Therapy:  Corrected Phenytoin level of 10-20 mcg/ml  Plan:  Continue to hold phenytoin Trend daily phenytoin levels + alb for correction  Rober Minion, PharmD., MS Clinical Pharmacist Pager:  [phone] Thank you for allowing pharmacy to be part of this patients care team.  **Pharmacist phone directory can now be found on [email or web address] ([email or web address]).  Listed under [place].

## 2019-08-26 NOTE — Progress Notes (Signed)
Took patient down to CT scan with security guards. Patient cooperative and calm

## 2019-08-26 NOTE — Plan of Care (Signed)
  Problem: Education: Goal: Knowledge of risk factors and measures for prevention of condition will improve Outcome: Progressing   Problem: Coping: Goal: Psychosocial and spiritual needs will be supported Outcome: Progressing   Problem: Respiratory: Goal: Will maintain a patent airway Outcome: Progressing Goal: Complications related to the disease process, condition or treatment will be avoided or minimized Outcome: Progressing   

## 2019-08-26 NOTE — Progress Notes (Addendum)
PROGRESS NOTE  Ricky Olsen OLI:[id number] DOB: [birth year] DOA: 08/24/2019 PCP: System, Pcp Not In   LOS: 2 days   Brief Narrative / Interim history: 64 year old male with DM 2, HTN, prior CVA, chronic anoxic encephalopathy, seizure disorder, CAD, legally blind, currently in prison and long-term hospital care due to total assist and bedbound status, was transferred from the prison hospital to [facility] in November 2020 for SNF type care, contracted Covid while at [facility], had hypoxia and was transferred to [facility] on 2/18  Subjective / 24h Interval events: -Remains without complaints, denies any abdominal pain, denies any nausea or vomiting.  Denies any shortness of breath, no chest pain, no abdominal pain, no nausea or vomiting -Per RN, patient has not passed gas or had a bowel movement since admission but was able to eat everything without nausea or vomiting  Assessment & Plan:  Principal Problem Acute Hypoxic Respiratory Failure due to Covid-19 Viral Illness -Requiring 2 L on admission but has been on room air since admission and remained so.  Respiratory status seems to be stable, patient appears comfortable satting in the mid 90s -Continue remdesivir, steroids, monitor inflammatory markers his CRP is quite elevated but improving -Chest x-ray done this admission with faint bibasilar opacities, possibly atelectasis  COVID-19 Labs  Recent Labs    08/24/19 1621 08/25/19 0157 08/26/19 0549  DDIMER 3.17* 2.21* 1.49*  CRP 17.2* 18.1* 13.6*    Active Problems Acute kidney injury -Creatinine was previously at 1.2 mid February, 2.1 when he left [facility] on 2/18, and continues to increase it to 2.7 this morning.  He has good urine output 1.8 L over the last 24 hours.  Urinalysis with bacteria, yeast, and leukocytes but does have a history of recurrent UTIs and colonization. -Bladder scan without significant residuals -Renal ultrasound without hydronephrosis and fairly  unremarkable -Continue IV fluids, if continues to get worse we will need nephrology input  Ileus -Patient had an episode of vomiting on 2/16, was diagnosed with an ileus at that time.  Abdominal x-ray on admission showed air-filled loops of large and small bowel suggesting ileus -He is relatively asymptomatic and tolerating diet however has not had a bowel movement nor is passing gas.  There are some reports of ileus in patients with COVID-19 but generally critically ill patients, but also may be related to phenytoin toxicity as there are some reports of that -Obtain a CT scan of the abdomen pelvis with oral contrast today to further evaluate  Essential hypertension -Blood pressure ranging between 90s to 100 systolic, continue to hold home lisinopril and keep on metoprolol at a lower dose  Morganella ESBL UTI -Diagnosed prior to admission, currently on meropenem which is scheduled to be done today on 2/20  Yeast UTI -Urinalysis shows yeast, start Diflucan  Hypomagnesemia/hypophosphatemia -Repleted, magnesium normalized this morning, replaced phosphorus Seizure disorder -On arrival to [facility] in 2020 he was on 50 mg every 8 of phenytoin however on discharge he was on 150 every 8 probably related to low phenytoin levels -Phenytoin is actually quite elevated here, pharmacy consulted  History of CVA -I do not see him being on aspirin, perhaps this was held when there was concern for GI bleed.  Defer to outpatient  Type 2 diabetes mellitus -Hold oral Metformin, placed on sliding scale CBG (last 3)  Recent Labs    08/25/19 1605 08/25/19 2002 08/26/19 0743  GLUCAP 130* 112* 105*   Iron deficiency anemia -There was concern for blood loss anemia from GI source  given positive fecal occult and required a blood transfusion in December, however EGD and colonoscopy work-up were negative.  There were mentions about a capsule study but that does not appear to have been done.  Will monitor  blood counts here -Continue iron supplementations -Hemoglobin 7.9 this morning, stable, appears to be dilutional  Anoxic brain injury/intermittent psychosis -Per notes, continue Zyprexa nightly  Coronary artery disease -Resume Imdur today  Scheduled Meds: . albuterol  2 puff Inhalation Q6H  . vitamin C  500 mg Oral Daily  . dexamethasone (DECADRON) injection  6 mg Intravenous Daily  . ferrous gluconate  324 mg Oral Q breakfast  . fluconazole  100 mg Oral Daily  . heparin  5,000 Units Subcutaneous Q8H  . insulin aspart  0-9 Units Subcutaneous TID WC  . metoprolol tartrate  12.5 mg Oral BID  . OLANZapine  5 mg Oral QHS  . pantoprazole  40 mg Oral Daily  . polyethylene glycol  17 g Oral BID  . senna-docusate  1 tablet Oral BID  . zinc sulfate  220 mg Oral Daily   Continuous Infusions: . dextrose 5 % and 0.45% NaCl 125 mL/hr at 08/26/19 0710  . meropenem (MERREM) IV 1 g (08/26/19 0931)  . remdesivir 100 mg in NS 100 mL 100 mg (08/26/19 0914)   PRN Meds:.acetaminophen, chlorpheniramine-HYDROcodone, guaiFENesin-dextromethorphan, ondansetron **OR** ondansetron (ZOFRAN) IV  DVT prophylaxis: Lovenox Code Status: Full code Family Communication: No family present Patient admitted from: [facility] Anticipated d/c place: [facility] Barriers to d/c: Acute medical work-up, IV remdesivir which is not available there  Consultants:  None   Procedures:  None   Microbiology: None   Antibacterials: Meropenem << 2/20   Objective: Vitals:   08/26/19 0200 08/26/19 0300 08/26/19 0400 08/26/19 0739  BP:   103/68 106/77  Pulse: 86 89 94 89  Resp: (!) 22 16 20 16   Temp:   97.9 F (36.6 C) 99.6 F (37.6 C)  TempSrc:   Axillary Oral  SpO2: 95% 98% 91% 98%  Weight:      Height:        Intake/Output Summary (Last 24 hours) at 08/26/2019 1125 Last data filed at 08/26/2019 0600 Gross per 24 hour  Intake 2375.22 ml  Output 1800 ml  Net 575.22 ml   Filed Weights   08/24/19  2000  Weight: 83.5 kg    Examination:  Constitutional: No distress, in bed Eyes: No scleral icterus ENMT: Moist mucous membranes Neck: normal, supple Respiratory: Clear to auscultation bilaterally, no wheezing, no crackles Cardiovascular: Regular rate and rhythm, no murmurs, no peripheral edema Abdomen: Soft, nontender, nondistended, bowel sounds diminished Musculoskeletal: no clubbing / cyanosis.  Skin: No rashes seen Neurologic: Contracted upper extremities, moves all 4 independently   Data Reviewed: I have independently reviewed following labs and imaging studies   CBC: Recent Labs  Lab 08/24/19 1621 08/25/19 0157 08/26/19 0549  WBC 8.0 7.1 6.2  NEUTROABS 7.0 4.9 4.5  HGB 8.2* 8.7* 7.9*  HCT 27.2* 28.7* 25.5*  MCV 71.4* 71.4* 68.7*  PLT 329 296 342   Basic Metabolic Panel: Recent Labs  Lab 08/24/19 1621 08/25/19 0157 08/26/19 0549  NA 135 137 137  K 5.0 4.6 4.0  CL 102 105 105  CO2 23 21* 22  GLUCOSE 113* 100* 100*  BUN 28* 31* 35*  CREATININE 2.38* 2.50* 2.79*  CALCIUM 8.5* 8.4* 8.1*  MG  --  1.6* 2.1  PHOS  --  2.9 1.4*   GFR: Estimated  Creatinine Clearance: 28.9 mL/min (A) (by C-G formula based on SCr of 2.79 mg/dL (H)). Liver Function Tests: Recent Labs  Lab 08/24/19 1621 08/25/19 0157 08/26/19 0549  AST 55* 58* 55*  ALT 20 19 17   ALKPHOS 107 105 107  BILITOT 0.4 0.7 0.3  PROT 6.0* 6.0* 5.9*  ALBUMIN 2.6* 2.5* 2.4*   No results for input(s): LIPASE, AMYLASE in the last 168 hours. No results for input(s): AMMONIA in the last 168 hours. Coagulation Profile: No results for input(s): INR, PROTIME in the last 168 hours. Cardiac Enzymes: No results for input(s): CKTOTAL, CKMB, CKMBINDEX, TROPONINI in the last 168 hours. BNP (last 3 results) No results for input(s): PROBNP in the last 8760 hours. HbA1C: Recent Labs    08/24/19 1621  HGBA1C 5.6   CBG: Recent Labs  Lab 08/25/19 0813 08/25/19 1234 08/25/19 1605 08/25/19 2002  08/26/19 0743  GLUCAP 98 146* 130* 112* 105*   Lipid Profile: No results for input(s): CHOL, HDL, LDLCALC, TRIG, CHOLHDL, LDLDIRECT in the last 72 hours. Thyroid Function Tests: No results for input(s): TSH, T4TOTAL, FREET4, T3FREE, THYROIDAB in the last 72 hours. Anemia Panel: No results for input(s): VITAMINB12, FOLATE, FERRITIN, TIBC, IRON, RETICCTPCT in the last 72 hours. Urine analysis:    Component Value Date/Time   COLORURINE YELLOW 08/25/2019 1640   APPEARANCEUR CLOUDY (A) 08/25/2019 1640   LABSPEC 1.014 08/25/2019 1640   PHURINE 6.0 08/25/2019 1640   GLUCOSEU 50 (A) 08/25/2019 1640   HGBUR NEGATIVE 08/25/2019 1640   BILIRUBINUR NEGATIVE 08/25/2019 1640   KETONESUR 5 (A) 08/25/2019 1640   PROTEINUR 100 (A) 08/25/2019 1640   NITRITE NEGATIVE 08/25/2019 1640   LEUKOCYTESUR LARGE (A) 08/25/2019 1640   Sepsis Labs: Invalid input(s): PROCALCITONIN, LACTICIDVEN  No results found for this or any previous visit (from the past 240 hour(s)).    Radiology Studies: CT ABDOMEN PELVIS WO CONTRAST  Result Date: 08/26/2019 CLINICAL DATA:  Bowel obstruction suspected EXAM: CT ABDOMEN AND PELVIS WITHOUT CONTRAST TECHNIQUE: Multidetector CT imaging of the abdomen and pelvis was performed following the standard protocol without IV contrast. COMPARISON:  Radiograph 08/24/2019 FINDINGS: Lower chest: Small pleural effusions. Ground-glass and airspace opacities in the lung bases, mostly dependent. Hepatobiliary: Negative, with some motion degradation. Pancreas: Unremarkable. No pancreatic ductal dilatation or surrounding inflammatory changes. Spleen: Normal in size without focal abnormality. Adrenals/Urinary Tract: Normal adrenal glands. Linear calcifications in the left renal hilum may be vascular versus calculi. No hydronephrosis. Urinary bladder physiologically distended, mildly thick-walled. Stomach/Bowel: Stomach is incompletely distended. Multiple gas and fluid distended proximal and mid  small bowel loops. Distal small bowel loops appear decompressed. There is no discrete transition point. Appendix unremarkable. The colon is nondilated. Fecal distention of the rectum. Vascular/Lymphatic: Extensive aortoiliac atherosclerosis (ICD10-170.0) without aneurysm. No abdominal or pelvic adenopathy. Reproductive: Prostate is unremarkable. Other: No ascites. No free air. There are streaky infiltrative/edematous/inflammatory changes in the retroperitoneum extending into the pelvis, most conspicuous overlying the left psoas below the aortic bifurcation. No well-defined drainable fluid collection, mass, or evident etiology. Musculoskeletal: Lower lumbar spondylitic changes. No fracture or worrisome bone lesion. Bilateral hip DJD. IMPRESSION: 1. Nonspecific inflammatory/edematous changes in the retroperitoneum. No imaging evidence of common potential etiologies including pancreatitis, peptic ulcer disease, obstructing urolithiasis, or aortic aneurysm. Correlate clinically. 2. Small bilateral pleural effusions. Electronically Signed   By: 08/26/2019 M.D.   On: 08/26/2019 11:13   DG Abd 1 View  Result Date: 08/24/2019 CLINICAL DATA:  Limited mobility. EXAM: ABDOMEN - 1 VIEW  COMPARISON:  None. FINDINGS: Multiple air-filled loops of large and small bowel are identified. Findings suggest ileus. No convincing evidence of obstruction. No free air, portal venous gas, or pneumatosis. IMPRESSION: Multiple prominent air-filled loops of large and small bowel suggesting the possibility of ileus. No evidence of obstruction. Electronically Signed   By: Dorise Bullion III M.D   On: 08/24/2019 19:47   US RENAL  Result Date: 08/25/2019 CLINICAL DATA:  Acute renal injury EXAM: RENAL / URINARY TRACT ULTRASOUND COMPLETE COMPARISON:  None. FINDINGS: Right Kidney: Renal measurements: 12.1 x 6.0 x 6.1 cm = volume: 233 mL . Echogenicity within normal limits. No mass or hydronephrosis visualized. Left Kidney: Renal measurements:  13.1 x 6.8 x 7.1 cm = volume: 327 mL. Echogenicity within normal limits. No mass or hydronephrosis visualized. Bladder: Appears normal for degree of bladder distention. Other: None. IMPRESSION: Normal renal ultrasound.  No hydronephrosis. Electronically Signed   By: Suzy Bouchard M.D.   On: 08/25/2019 15:38   Portable chest 1 View  Result Date: 08/24/2019 CLINICAL DATA:  COVID-19 EXAM: PORTABLE CHEST 1 VIEW COMPARISON:  04/20/2019 FINDINGS: Shallow lung inflation with faint bibasilar opacity, likely atelectasis. No pleural effusion or pneumothorax. Normal cardiomediastinal contours. IMPRESSION: Shallow lung inflation with faint bibasilar opacities, likely atelectasis. Electronically Signed   By: Ulyses Jarred M.D.   On: 08/24/2019 19:16   Marzetta Board, MD, PhD [facility]  Between 7 am - 7 pm I am available, please contact me via Amion or Securechat  Between 7 pm - 7 am I am not available, please contact night coverage MD/APP via Amion

## 2019-08-27 ENCOUNTER — Inpatient Hospital Stay (HOSPITAL_COMMUNITY)

## 2019-08-27 LAB — COMPREHENSIVE METABOLIC PANEL
ALT: 16 U/L (ref 0–44)
AST: 54 U/L — ABNORMAL HIGH (ref 15–41)
Albumin: 2.4 g/dL — ABNORMAL LOW (ref 3.5–5.0)
Alkaline Phosphatase: 114 U/L (ref 38–126)
Anion gap: 10 (ref 5–15)
BUN: 31 mg/dL — ABNORMAL HIGH (ref 8–23)
CO2: 20 mmol/L — ABNORMAL LOW (ref 22–32)
Calcium: 7.9 mg/dL — ABNORMAL LOW (ref 8.9–10.3)
Chloride: 110 mmol/L (ref 98–111)
Creatinine, Ser: 2.67 mg/dL — ABNORMAL HIGH (ref 0.61–1.24)
GFR calc Af Amer: 28 mL/min — ABNORMAL LOW (ref >60)
GFR calc non Af Amer: 24 mL/min — ABNORMAL LOW (ref >60)
Glucose, Bld: 80 mg/dL (ref 70–99)
Potassium: 4.1 mmol/L (ref 3.5–5.1)
Sodium: 140 mmol/L (ref 135–145)
Total Bilirubin: 0.3 mg/dL (ref 0.3–1.2)
Total Protein: 5.4 g/dL — ABNORMAL LOW (ref 6.5–8.1)

## 2019-08-27 LAB — CBC WITH DIFFERENTIAL/PLATELET
Abs Immature Granulocytes: 0.03 10*3/uL (ref 0.00–0.07)
Basophils Absolute: 0 10*3/uL (ref 0.0–0.1)
Basophils Relative: 0 %
Eosinophils Absolute: 0 10*3/uL (ref 0.0–0.5)
Eosinophils Relative: 1 %
HCT: 24.5 % — ABNORMAL LOW (ref 39.0–52.0)
Hemoglobin: 7.5 g/dL — ABNORMAL LOW (ref 13.0–17.0)
Immature Granulocytes: 1 %
Lymphocytes Relative: 22 %
Lymphs Abs: 1.3 10*3/uL (ref 0.7–4.0)
MCH: 21.2 pg — ABNORMAL LOW (ref 26.0–34.0)
MCHC: 30.6 g/dL (ref 30.0–36.0)
MCV: 69.4 fL — ABNORMAL LOW (ref 80.0–100.0)
Monocytes Absolute: 0.7 10*3/uL (ref 0.1–1.0)
Monocytes Relative: 12 %
Neutro Abs: 3.6 10*3/uL (ref 1.7–7.7)
Neutrophils Relative %: 64 %
Platelets: 366 10*3/uL (ref 150–400)
RBC: 3.53 MIL/uL — ABNORMAL LOW (ref 4.22–5.81)
RDW: 19.6 % — ABNORMAL HIGH (ref 11.5–15.5)
WBC: 5.7 10*3/uL (ref 4.0–10.5)
nRBC: 0 % (ref 0.0–0.2)

## 2019-08-27 LAB — PHENYTOIN LEVEL, TOTAL: Phenytoin Lvl: 24.3 ug/mL — ABNORMAL HIGH (ref 10.0–20.0)

## 2019-08-27 LAB — GLUCOSE, CAPILLARY
Glucose-Capillary: 100 mg/dL — ABNORMAL HIGH (ref 70–99)
Glucose-Capillary: 138 mg/dL — ABNORMAL HIGH (ref 70–99)
Glucose-Capillary: 173 mg/dL — ABNORMAL HIGH (ref 70–99)
Glucose-Capillary: 94 mg/dL (ref 70–99)

## 2019-08-27 LAB — URINE CULTURE: Culture: 80000 — AB

## 2019-08-27 LAB — PHOSPHORUS: Phosphorus: 2.2 mg/dL — ABNORMAL LOW (ref 2.5–4.6)

## 2019-08-27 LAB — C-REACTIVE PROTEIN: CRP: 11.7 mg/dL — ABNORMAL HIGH (ref <1.0)

## 2019-08-27 LAB — MAGNESIUM: Magnesium: 2 mg/dL (ref 1.7–2.4)

## 2019-08-27 LAB — D-DIMER, QUANTITATIVE: D-Dimer, Quant: 1.89 ug/mL-FEU — ABNORMAL HIGH (ref 0.00–0.50)

## 2019-08-27 MED ORDER — SODIUM PHOSPHATES 45 MMOLE/15ML IV SOLN
15.0000 mmol | Freq: Once | INTRAVENOUS | Status: AC
  Administered 2019-08-27: 15 mmol via INTRAVENOUS
  Filled 2019-08-27: qty 5

## 2019-08-27 NOTE — Progress Notes (Signed)
MEDICATION RELATED CONSULT NOTE - Follow-Up   Pharmacy Consult for Dilantin Indication: History of seizures  No Known Allergies  Patient Measurements: Height: 5\' 11"  (180.3 cm) Weight: 184 lb (83.5 kg) IBW/kg (Calculated) : 75.3 Adjusted Body Weight: 78.6 kg  Labs: Recent Labs    08/25/19 0157 08/26/19 0549 08/27/19 0200  WBC 7.1 6.2 5.7  HGB 8.7* 7.9* 7.5*  HCT 28.7* 25.5* 24.5*  PLT 296 342 366  CREATININE 2.50* 2.79* 2.67*  MG 1.6* 2.1 2.0  PHOS 2.9 1.4* 2.2*  ALBUMIN 2.5* 2.4* 2.4*  PROT 6.0* 5.9* 5.4*  AST 58* 55* 54*  ALT 19 17 16   ALKPHOS 105 107 114  BILITOT 0.7 0.3 0.3    Estimated Creatinine Clearance: 30.2 mL/min (A) (by C-G formula based on SCr of 2.67 mg/dL (H)).  Assessment: Pt was tx from [facility] earlier today for COVID. He has been on phenytoin outpt for seizure with a relatively high dose with levels noted to be supratherapeutic on admission. Phenytoin is now held and trending levels for plans to resume.   The patient's corrected phenytoin level today remains SUPRAtherapeutic (measured 24.3, alb 2.4 >> corrects to 41.9). Also DDI with fluconazole which decreases phenytoin metabolism thereby increasing concentrations. Will continue to hold doses and upon resuming will start at a lower dose.   Goal of Therapy:  Corrected Phenytoin level of 10-20 mcg/ml  Plan:  Continue to hold phenytoin Trend daily phenytoin levels + alb for correction Plan to resume at lower dose as able  Thank you for involving pharmacy in this patient's care.  08/29/19, PharmD, BCPS Clinical Pharmacist Clinical phone for 08/27/2019 until 3p is Loura Back 08/27/2019 9:42 AM  **Pharmacist phone directory can be found on [email or web address] listed under [facility]**

## 2019-08-27 NOTE — Plan of Care (Signed)
  Problem: Education: Goal: Knowledge of risk factors and measures for prevention of condition will improve Outcome: Progressing   Problem: Coping: Goal: Psychosocial and spiritual needs will be supported Outcome: Progressing   Problem: Respiratory: Goal: Will maintain a patent airway Outcome: Progressing Goal: Complications related to the disease process, condition or treatment will be avoided or minimized Outcome: Progressing   

## 2019-08-27 NOTE — Progress Notes (Signed)
PROGRESS NOTE  Ricky Olsen WLN:[id number] DOB: [birth year] DOA: 08/24/2019 PCP: System, Pcp Not In   LOS: 3 days   Brief Narrative / Interim history: 64 year old male with DM 2, HTN, prior CVA, chronic anoxic encephalopathy, seizure disorder, CAD, legally blind, currently in prison and long-term hospital care due to total assist and bedbound status, was transferred from the prison hospital to [facility] in November 2020 for SNF type care, contracted Covid while at [facility], had hypoxia and was transferred to [facility] on 2/18  Subjective / 24h Interval events: -No complaints for me, no abdominal pain, no nausea or vomiting.  Ate well for breakfast.  Had a large bowel movement yesterday after an enema.  Denies any shortness of breath  Assessment & Plan:  Principal Problem Acute Hypoxic Respiratory Failure due to Covid-19 Viral Illness -Requiring 2 L on admission but has been on room air since admission and remained so.  Respiratory status seems to be stable, patient appears comfortable satting in the mid 90s -Continue remdesivir, steroids, monitor inflammatory markers his CRP is quite elevated but improving -Chest x-ray done this admission with faint bibasilar opacities, possibly atelectasis   COVID-19 Labs  Recent Labs    08/25/19 0157 08/26/19 0549 08/27/19 0200  DDIMER 2.21* 1.49* 1.89*  CRP 18.1* 13.6* 11.7*    Active Problems Acute kidney injury -Creatinine was previously at 1.2 mid February, 2.1 when he left [facility] on 2/18, and getting worse during the first couple of days here.  Finally creatinine started to improve today.  Continue fluids -Urinalysis with bacteria, yeast, and leukocytes but does have a history of recurrent UTIs and colonization. -Bladder scan without significant residuals -Renal ultrasound without hydronephrosis and fairly unremarkable -Continue IV fluids, if gets worse will need nephrology consultation  Ileus -Patient had an episode of vomiting on  2/16, was diagnosed with an ileus at that time.  Abdominal x-ray on admission showed air-filled loops of large and small bowel suggesting ileus, possibly multifactorial due to underlying chronic anoxic brain injury which can predispose to this as well as supratherapeutic phenytoin levels which its toxicity can rarely manifest as ileus -He is asymptomatic from this, no nausea or vomiting.  CT scan yesterday of the abdomen and pelvis with nonspecific retroperitoneal inflammation of unclear etiology.  It did also show large stool burden.  He underwent an enema with excellent results -Has not had a bowel movement since his enema however has been passing gas -Abdominal x-ray today looks improved, advance diet  Essential hypertension -Blood pressure actually within normal limits, continue to hold home lisinopril and keep on metoprolol at a lower dose  Morganella ESBL UTI -Diagnosed prior to admission, completed a course of 10-day meropenem on 2/20  Yeast UTI -Urinalysis shows yeast, start Diflucan, first day 2/19  Hypomagnesemia/hypophosphatemia -Repleted, magnesium normalized this morning, replaced phosphorus  Seizure disorder with supratherapeutic phenytoin levels -On arrival to [facility] in 2020 he was on 50 mg every 8 of phenytoin however on discharge he was on 150 every 8 hours, suspect latter due to low phenytoin levels, however he is supratherapeutic here -Phenytoin is actually quite elevated here, pharmacy consulted, following levels, will probably need to be readjusted on discharge to 50 mg every 8 hours  History of CVA -I do not see him being on aspirin, perhaps this was held when there was concern for GI bleed.  Defer to outpatient  Type 2 diabetes mellitus -Hold oral Metformin, placed on sliding scale  CBG (last 3)  Recent Labs  08/26/19 1655 08/26/19 1951 08/27/19 0737  GLUCAP 143* 123* 100*   Iron deficiency anemia -There was concern for blood loss anemia from GI source  given positive fecal occult and required a blood transfusion in December, however EGD and colonoscopy work-up were negative.  There were mentions about a capsule study but that does not appear to have been done.  Will monitor blood counts here -Continue iron supplementations -Hemoglobin in the 7 range today, slightly decreased, no evidence of bleeding, appears to be dilutional  Anoxic brain injury/intermittent psychosis -Per notes, continue Zyprexa nightly  Coronary artery disease -Continue Imdur  Scheduled Meds: . albuterol  2 puff Inhalation Q6H  . vitamin C  500 mg Oral Daily  . dexamethasone (DECADRON) injection  6 mg Intravenous Daily  . ferrous gluconate  324 mg Oral Q breakfast  . fluconazole  100 mg Oral Daily  . heparin  5,000 Units Subcutaneous Q8H  . insulin aspart  0-9 Units Subcutaneous TID WC  . metoprolol tartrate  12.5 mg Oral BID  . OLANZapine  5 mg Oral QHS  . pantoprazole  40 mg Oral Daily  . polyethylene glycol  17 g Oral BID  . senna-docusate  1 tablet Oral BID  . zinc sulfate  220 mg Oral Daily   Continuous Infusions: . dextrose 5 % and 0.45% NaCl 125 mL/hr at 08/27/19 0736  . remdesivir 100 mg in NS 100 mL 100 mg (08/27/19 0942)  . sodium phosphate  Dextrose 5% IVPB     PRN Meds:.acetaminophen, chlorpheniramine-HYDROcodone, guaiFENesin-dextromethorphan, ondansetron **OR** ondansetron (ZOFRAN) IV  DVT prophylaxis: Lovenox Code Status: Full code Family Communication: No family present Patient admitted from: [place] Anticipated d/c place: [facility] Barriers to d/c: Acute medical work-up, back to [facility] when finishing IV remdesivir, improved renal failure  Consultants:  None   Procedures:  None   Microbiology: None   Antibacterials: Meropenem << 2/20   Objective: Vitals:   08/27/19 0348 08/27/19 0400 08/27/19 0500 08/27/19 0732  BP:  112/73  129/80  Pulse:  92 87 95  Resp: 16 17 18 18   Temp:    98.1 F (36.7 C)  TempSrc:    Oral    SpO2:  95% 100% 94%  Weight:      Height:        Intake/Output Summary (Last 24 hours) at 08/27/2019 0959 Last data filed at 08/26/2019 1700 Gross per 24 hour  Intake 700 ml  Output 1151 ml  Net -451 ml   Filed Weights   08/24/19 2000  Weight: 83.5 kg    Examination:  Constitutional: No distress, laying in bed ENMT: MMM Neck: normal, supple Respiratory: Clear bilaterally, no wheezing, no crackles Cardiovascular: Regular rate and rhythm, no murmurs, no edema Abdomen: Soft, nontender, nondistended, positive bowel sounds Musculoskeletal: no clubbing / cyanosis.  Skin: No rashes seen Neurologic: Contracted upper extremities, moves all 4 independently  Data Reviewed: I have independently reviewed following labs and imaging studies   CBC: Recent Labs  Lab 08/24/19 1621 08/25/19 0157 08/26/19 0549 08/27/19 0200  WBC 8.0 7.1 6.2 5.7  NEUTROABS 7.0 4.9 4.5 3.6  HGB 8.2* 8.7* 7.9* 7.5*  HCT 27.2* 28.7* 25.5* 24.5*  MCV 71.4* 71.4* 68.7* 69.4*  PLT 329 296 342 737   Basic Metabolic Panel: Recent Labs  Lab 08/24/19 1621 08/25/19 0157 08/26/19 0549 08/27/19 0200  NA 135 137 137 140  K 5.0 4.6 4.0 4.1  CL 102 105 105 110  CO2 23 21* 22 20*  GLUCOSE 113* 100* 100* 80  BUN 28* 31* 35* 31*  CREATININE 2.38* 2.50* 2.79* 2.67*  CALCIUM 8.5* 8.4* 8.1* 7.9*  MG  --  1.6* 2.1 2.0  PHOS  --  2.9 1.4* 2.2*   GFR: Estimated Creatinine Clearance: 30.2 mL/min (A) (by C-G formula based on SCr of 2.67 mg/dL (H)). Liver Function Tests: Recent Labs  Lab 08/24/19 1621 08/25/19 0157 08/26/19 0549 08/27/19 0200  AST 55* 58* 55* 54*  ALT 20 19 17 16   ALKPHOS 107 105 107 114  BILITOT 0.4 0.7 0.3 0.3  PROT 6.0* 6.0* 5.9* 5.4*  ALBUMIN 2.6* 2.5* 2.4* 2.4*   Recent Labs  Lab 08/26/19 0500  LIPASE 43   No results for input(s): AMMONIA in the last 168 hours. Coagulation Profile: No results for input(s): INR, PROTIME in the last 168 hours. Cardiac Enzymes: No results for  input(s): CKTOTAL, CKMB, CKMBINDEX, TROPONINI in the last 168 hours. BNP (last 3 results) No results for input(s): PROBNP in the last 8760 hours. HbA1C: Recent Labs    08/24/19 1621  HGBA1C 5.6   CBG: Recent Labs  Lab 08/26/19 0743 08/26/19 1208 08/26/19 1655 08/26/19 1951 08/27/19 0737  GLUCAP 105* 129* 143* 123* 100*   Lipid Profile: No results for input(s): CHOL, HDL, LDLCALC, TRIG, CHOLHDL, LDLDIRECT in the last 72 hours. Thyroid Function Tests: No results for input(s): TSH, T4TOTAL, FREET4, T3FREE, THYROIDAB in the last 72 hours. Anemia Panel: No results for input(s): VITAMINB12, FOLATE, FERRITIN, TIBC, IRON, RETICCTPCT in the last 72 hours. Urine analysis:    Component Value Date/Time   COLORURINE YELLOW 08/25/2019 1640   APPEARANCEUR CLOUDY (A) 08/25/2019 1640   LABSPEC 1.014 08/25/2019 1640   PHURINE 6.0 08/25/2019 1640   GLUCOSEU 50 (A) 08/25/2019 1640   HGBUR NEGATIVE 08/25/2019 1640   BILIRUBINUR NEGATIVE 08/25/2019 1640   KETONESUR 5 (A) 08/25/2019 1640   PROTEINUR 100 (A) 08/25/2019 1640   NITRITE NEGATIVE 08/25/2019 1640   LEUKOCYTESUR LARGE (A) 08/25/2019 1640   Sepsis Labs: Invalid input(s): PROCALCITONIN, LACTICIDVEN  No results found for this or any previous visit (from the past 240 hour(s)).    Radiology Studies: CT ABDOMEN PELVIS WO CONTRAST  Result Date: 08/26/2019 CLINICAL DATA:  Bowel obstruction suspected EXAM: CT ABDOMEN AND PELVIS WITHOUT CONTRAST TECHNIQUE: Multidetector CT imaging of the abdomen and pelvis was performed following the standard protocol without IV contrast. COMPARISON:  Radiograph 08/24/2019 FINDINGS: Lower chest: Small pleural effusions. Ground-glass and airspace opacities in the lung bases, mostly dependent. Hepatobiliary: Negative, with some motion degradation. Pancreas: Unremarkable. No pancreatic ductal dilatation or surrounding inflammatory changes. Spleen: Normal in size without focal abnormality. Adrenals/Urinary  Tract: Normal adrenal glands. Linear calcifications in the left renal hilum may be vascular versus calculi. No hydronephrosis. Urinary bladder physiologically distended, mildly thick-walled. Stomach/Bowel: Stomach is incompletely distended. Multiple gas and fluid distended proximal and mid small bowel loops. Distal small bowel loops appear decompressed. There is no discrete transition point. Appendix unremarkable. The colon is nondilated. Fecal distention of the rectum. Vascular/Lymphatic: Extensive aortoiliac atherosclerosis (ICD10-170.0) without aneurysm. No abdominal or pelvic adenopathy. Reproductive: Prostate is unremarkable. Other: No ascites. No free air. There are streaky infiltrative/edematous/inflammatory changes in the retroperitoneum extending into the pelvis, most conspicuous overlying the left psoas below the aortic bifurcation. No well-defined drainable fluid collection, mass, or evident etiology. Musculoskeletal: Lower lumbar spondylitic changes. No fracture or worrisome bone lesion. Bilateral hip DJD. IMPRESSION: 1. Nonspecific inflammatory/edematous changes in the retroperitoneum. No imaging evidence of common potential etiologies  including pancreatitis, peptic ulcer disease, obstructing urolithiasis, or aortic aneurysm. Correlate clinically. 2. Small bilateral pleural effusions. Electronically Signed   By: Corlis Leak M.D.   On: 08/26/2019 11:13   DG Abd 1 View  Result Date: 08/27/2019 CLINICAL DATA:  Ileus EXAM: ABDOMEN - 1 VIEW COMPARISON:  08/26/2019 CT abdomen/pelvis FINDINGS: No disproportionately dilated small bowel loops. Mild colonic gas appears decreased in the interval. No evidence of pneumatosis or pneumoperitoneum. No radiopaque nephrolithiasis. Clear lung bases. IMPRESSION: Nonobstructive bowel gas pattern.  Colonic gas appears decreased. Electronically Signed   By: Delbert Phenix M.D.   On: 08/27/2019 09:38   US RENAL  Result Date: 08/25/2019 CLINICAL DATA:  Acute renal injury  EXAM: RENAL / URINARY TRACT ULTRASOUND COMPLETE COMPARISON:  None. FINDINGS: Right Kidney: Renal measurements: 12.1 x 6.0 x 6.1 cm = volume: 233 mL . Echogenicity within normal limits. No mass or hydronephrosis visualized. Left Kidney: Renal measurements: 13.1 x 6.8 x 7.1 cm = volume: 327 mL. Echogenicity within normal limits. No mass or hydronephrosis visualized. Bladder: Appears normal for degree of bladder distention. Other: None. IMPRESSION: Normal renal ultrasound.  No hydronephrosis. Electronically Signed   By: Genevive Bi M.D.   On: 08/25/2019 15:38   Pamella Pert, MD, PhD [facility]  Between 7 am - 7 pm I am available, please contact me via Amion or Securechat  Between 7 pm - 7 am I am not available, please contact night coverage MD/APP via Amion

## 2019-08-28 LAB — PHENYTOIN LEVEL, TOTAL: Phenytoin Lvl: 19.3 ug/mL (ref 10.0–20.0)

## 2019-08-28 LAB — COMPREHENSIVE METABOLIC PANEL
ALT: 14 U/L (ref 0–44)
AST: 45 U/L — ABNORMAL HIGH (ref 15–41)
Albumin: 2.3 g/dL — ABNORMAL LOW (ref 3.5–5.0)
Alkaline Phosphatase: 128 U/L — ABNORMAL HIGH (ref 38–126)
Anion gap: 9 (ref 5–15)
BUN: 26 mg/dL — ABNORMAL HIGH (ref 8–23)
CO2: 19 mmol/L — ABNORMAL LOW (ref 22–32)
Calcium: 8 mg/dL — ABNORMAL LOW (ref 8.9–10.3)
Chloride: 112 mmol/L — ABNORMAL HIGH (ref 98–111)
Creatinine, Ser: 2.42 mg/dL — ABNORMAL HIGH (ref 0.61–1.24)
GFR calc Af Amer: 32 mL/min — ABNORMAL LOW (ref >60)
GFR calc non Af Amer: 27 mL/min — ABNORMAL LOW (ref >60)
Glucose, Bld: 91 mg/dL (ref 70–99)
Potassium: 3.9 mmol/L (ref 3.5–5.1)
Sodium: 140 mmol/L (ref 135–145)
Total Bilirubin: 0.4 mg/dL (ref 0.3–1.2)
Total Protein: 5.3 g/dL — ABNORMAL LOW (ref 6.5–8.1)

## 2019-08-28 LAB — CBC WITH DIFFERENTIAL/PLATELET
Abs Immature Granulocytes: 0.03 10*3/uL (ref 0.00–0.07)
Basophils Absolute: 0 10*3/uL (ref 0.0–0.1)
Basophils Relative: 0 %
Eosinophils Absolute: 0.1 10*3/uL (ref 0.0–0.5)
Eosinophils Relative: 1 %
HCT: 24.7 % — ABNORMAL LOW (ref 39.0–52.0)
Hemoglobin: 7.4 g/dL — ABNORMAL LOW (ref 13.0–17.0)
Immature Granulocytes: 1 %
Lymphocytes Relative: 20 %
Lymphs Abs: 1.3 10*3/uL (ref 0.7–4.0)
MCH: 21.1 pg — ABNORMAL LOW (ref 26.0–34.0)
MCHC: 30 g/dL (ref 30.0–36.0)
MCV: 70.4 fL — ABNORMAL LOW (ref 80.0–100.0)
Monocytes Absolute: 0.7 10*3/uL (ref 0.1–1.0)
Monocytes Relative: 10 %
Neutro Abs: 4.4 10*3/uL (ref 1.7–7.7)
Neutrophils Relative %: 68 %
Platelets: 396 10*3/uL (ref 150–400)
RBC: 3.51 MIL/uL — ABNORMAL LOW (ref 4.22–5.81)
RDW: 19.5 % — ABNORMAL HIGH (ref 11.5–15.5)
WBC: 6.5 10*3/uL (ref 4.0–10.5)
nRBC: 0 % (ref 0.0–0.2)

## 2019-08-28 LAB — D-DIMER, QUANTITATIVE: D-Dimer, Quant: 1.95 ug/mL-FEU — ABNORMAL HIGH (ref 0.00–0.50)

## 2019-08-28 LAB — GLUCOSE, CAPILLARY
Glucose-Capillary: 92 mg/dL (ref 70–99)
Glucose-Capillary: 144 mg/dL — ABNORMAL HIGH (ref 70–99)
Glucose-Capillary: 139 mg/dL — ABNORMAL HIGH (ref 70–99)

## 2019-08-28 LAB — PREPARE RBC (CROSSMATCH)

## 2019-08-28 LAB — PHOSPHORUS: Phosphorus: 2.8 mg/dL (ref 2.5–4.6)

## 2019-08-28 LAB — C-REACTIVE PROTEIN: CRP: 9.6 mg/dL — ABNORMAL HIGH (ref <1.0)

## 2019-08-28 LAB — MAGNESIUM: Magnesium: 1.9 mg/dL (ref 1.7–2.4)

## 2019-08-28 MED ORDER — SODIUM CHLORIDE 0.9% IV SOLUTION: Freq: Once | INTRAVENOUS | Status: AC

## 2019-08-28 NOTE — Progress Notes (Addendum)
PROGRESS NOTE  Ricky Olsen DGU:[id number] DOB: 22-May-1956 DOA: 08/24/2019 PCP: System, Pcp Not In   LOS: 4 days   Brief Narrative / Interim history: 64 year old male with DM 2, HTN, prior CVA, chronic anoxic encephalopathy, seizure disorder, CAD, legally blind, currently in prison and long-term hospital care due to total assist and bedbound status, was transferred from the prison hospital to [facility] in November 2020 for SNF type care, contracted Covid while at [facility], had hypoxia and was transferred to [facility] on 2/18  He has a state guardian, Debby Freiberg [phone] ext [phone] Upon discharge she would like a copy of the dc summary faxed to [phone]  Subjective / 24h Interval events: -No complaints.  No abdominal pain, no nausea or vomiting.  No shortness of breath  Assessment & Plan:  Principal Problem Acute Hypoxic Respiratory Failure due to Covid-19 Viral Illness -Requiring 2 L on admission but has been on room air since admission and remained so.  Respiratory status seems to be stable, patient appears comfortable satting in the mid 90s -Completing remdesivir, continue steroids, inflammatory markers improving -Chest x-ray done this admission with faint bibasilar opacities, possibly atelectasis   COVID-19 Labs  Recent Labs    08/26/19 0549 08/27/19 0200 08/28/19 0130  DDIMER 1.49* 1.89* 1.95*  CRP 13.6* 11.7* 9.6*    Active Problems Acute kidney injury -Creatinine was previously at 1.2 mid February, 2.1 when he left [facility] on 2/18, and getting worse during the first couple of days here.  Finally creatinine started to improve today.  Continue fluids -Urinalysis with bacteria, yeast, and leukocytes but does have a history of recurrent UTIs and colonization. -Bladder scan without significant residuals -Renal ultrasound without hydronephrosis and fairly unremarkable -Continue IV fluids, finally improving some today,  Ileus -Patient had an episode of  vomiting on 2/16, was diagnosed with an ileus at that time.  Abdominal x-ray on admission showed air-filled loops of large and small bowel suggesting ileus, possibly multifactorial due to underlying chronic anoxic brain injury which can predispose to this as well as supratherapeutic phenytoin levels which its toxicity can rarely manifest as ileus -He is asymptomatic from this, no nausea or vomiting.  CT scan of the abdomen and pelvis with nonspecific retroperitoneal inflammation of unclear etiology.  It did also show large stool burden.  He underwent an enema with excellent results -Following enema had a repeat abdominal x-ray with improvement in his ileus, his diet was advanced and he was tolerating that well  Essential hypertension -Blood pressure actually within normal limits, continue to hold home lisinopril and keep on metoprolol at a lower dose.  Discontinue lisinopril on discharge  Morganella ESBL UTI -Diagnosed prior to admission, completed a course of 10-day meropenem on 2/20  Yeast UTI -Urinalysis shows yeast, start Diflucan, first day 2/19, plan for 5-7 days  Hypomagnesemia/hypophosphatemia -Repleted, magnesium normalized this morning, replaced phosphorus  Seizure disorder with supratherapeutic phenytoin levels -On arrival to [facility] in 2020 he was on 50 mg every 8 of phenytoin however on discharge to [place] he was on 150 every 8 hours, adjusted probably based on low phenytoin levels prior to admission here -Phenytoin was actually quite elevated here, pharmacy consulted, following levels, will probably need to be readjusted on discharge to 50 mg every 8 hours like he was prior to [facility] admission  History of CVA -I do not see him being on aspirin, perhaps this was held when there was concern for GI bleed.  Defer to outpatient  Type 2 diabetes mellitus -  Hold oral Metformin, placed on sliding scale  CBG (last 3)  Recent Labs    08/27/19 1652 08/27/19 1934 08/28/19 0744  GLUCAP  173* 94 92   Iron deficiency anemia -There was concern for blood loss anemia from GI source given positive fecal occult and required a blood transfusion in December, however EGD and colonoscopy work-up were negative.  There were mentions about a capsule study but that does not appear to have been done.  Will monitor blood counts here -Continue iron supplementations -Transfuse unit of packed red blood cells today for hemoglobin of 7.4, downtrending, no evidence of bleeding -verbal consent obtained from state guardian, Debby Freiberg [phone]  Anoxic brain injury/intermittent psychosis -Per notes, continue Zyprexa nightly  Coronary artery disease -Continue Imdur  Scheduled Meds: . sodium chloride   Intravenous Once  . albuterol  2 puff Inhalation Q6H  . vitamin C  500 mg Oral Daily  . dexamethasone (DECADRON) injection  6 mg Intravenous Daily  . ferrous gluconate  324 mg Oral Q breakfast  . fluconazole  100 mg Oral Daily  . heparin  5,000 Units Subcutaneous Q8H  . insulin aspart  0-9 Units Subcutaneous TID WC  . metoprolol tartrate  12.5 mg Oral BID  . OLANZapine  5 mg Oral QHS  . pantoprazole  40 mg Oral Daily  . polyethylene glycol  17 g Oral BID  . senna-docusate  1 tablet Oral BID  . zinc sulfate  220 mg Oral Daily   Continuous Infusions: . dextrose 5 % and 0.45% NaCl 125 mL/hr at 08/27/19 0736  . remdesivir 100 mg in NS 100 mL 100 mg (08/27/19 0942)   PRN Meds:.acetaminophen, chlorpheniramine-HYDROcodone, guaiFENesin-dextromethorphan, ondansetron **OR** ondansetron (ZOFRAN) IV  DVT prophylaxis: Lovenox Code Status: Full code Family Communication: No family present Patient admitted from: [facility] Anticipated d/c place: [facility] Barriers to d/c: [facility] 1 to 2 days once renal failure improving, will call on the day of discharge  Consultants:  None   Procedures:  None   Microbiology: None   Antibacterials: Meropenem << 2/20    Objective: Vitals:   08/27/19 1933 08/27/19 2329 08/28/19 0432 08/28/19 0743  BP: 131/70 110/62 122/69 118/71  Pulse: 72 82 82 83  Resp:  (!) 28 (!) 22 (!) 21  Temp: 98.9 F (37.2 C) 99.1 F (37.3 C) 98.2 F (36.8 C) 98.5 F (36.9 C)  TempSrc: Oral Oral Oral Oral  SpO2:  98% 97% 99%  Weight:      Height:        Intake/Output Summary (Last 24 hours) at 08/28/2019 1005 Last data filed at 08/28/2019 0900 Gross per 24 hour  Intake 825 ml  Output 2300 ml  Net -1475 ml   Filed Weights   08/24/19 2000  Weight: 83.5 kg    Examination:  Constitutional: NAD ENMT: Moist mucous membranes Neck: normal, supple Respiratory: Clear bilaterally, no wheezing Cardiovascular: Regular rate and rhythm, no murmurs Abdomen: Soft, nontender, nondistended, positive bowel sounds Musculoskeletal: no clubbing / cyanosis.  Skin: No rashes seen Neurologic: Contracted upper extremities, moves all 4 independently  Data Reviewed: I have independently reviewed following labs and imaging studies   CBC: Recent Labs  Lab 08/24/19 1621 08/25/19 0157 08/26/19 0549 08/27/19 0200 08/28/19 0130  WBC 8.0 7.1 6.2 5.7 6.5  NEUTROABS 7.0 4.9 4.5 3.6 4.4  HGB 8.2* 8.7* 7.9* 7.5* 7.4*  HCT 27.2* 28.7* 25.5* 24.5* 24.7*  MCV 71.4* 71.4* 68.7* 69.4* 70.4*  PLT 329 296 342  366 396   Basic Metabolic Panel: Recent Labs  Lab 08/24/19 1621 08/25/19 0157 08/26/19 0549 08/27/19 0200 08/28/19 0130  NA 135 137 137 140 140  K 5.0 4.6 4.0 4.1 3.9  CL 102 105 105 110 112*  CO2 23 21* 22 20* 19*  GLUCOSE 113* 100* 100* 80 91  BUN 28* 31* 35* 31* 26*  CREATININE 2.38* 2.50* 2.79* 2.67* 2.42*  CALCIUM 8.5* 8.4* 8.1* 7.9* 8.0*  MG  --  1.6* 2.1 2.0 1.9  PHOS  --  2.9 1.4* 2.2* 2.8   GFR: Estimated Creatinine Clearance: 33.3 mL/min (A) (by C-G formula based on SCr of 2.42 mg/dL (H)). Liver Function Tests: Recent Labs  Lab 08/24/19 1621 08/25/19 0157 08/26/19 0549 08/27/19 0200 08/28/19 0130  AST  55* 58* 55* 54* 45*  ALT 20 19 17 16 14   ALKPHOS 107 105 107 114 128*  BILITOT 0.4 0.7 0.3 0.3 0.4  PROT 6.0* 6.0* 5.9* 5.4* 5.3*  ALBUMIN 2.6* 2.5* 2.4* 2.4* 2.3*   Recent Labs  Lab 08/26/19 0500  LIPASE 43   No results for input(s): AMMONIA in the last 168 hours. Coagulation Profile: No results for input(s): INR, PROTIME in the last 168 hours. Cardiac Enzymes: No results for input(s): CKTOTAL, CKMB, CKMBINDEX, TROPONINI in the last 168 hours. BNP (last 3 results) No results for input(s): PROBNP in the last 8760 hours. HbA1C: No results for input(s): HGBA1C in the last 72 hours. CBG: Recent Labs  Lab 08/27/19 0737 08/27/19 1152 08/27/19 1652 08/27/19 1934 08/28/19 0744  GLUCAP 100* 138* 173* 94 92   Lipid Profile: No results for input(s): CHOL, HDL, LDLCALC, TRIG, CHOLHDL, LDLDIRECT in the last 72 hours. Thyroid Function Tests: No results for input(s): TSH, T4TOTAL, FREET4, T3FREE, THYROIDAB in the last 72 hours. Anemia Panel: No results for input(s): VITAMINB12, FOLATE, FERRITIN, TIBC, IRON, RETICCTPCT in the last 72 hours. Urine analysis:    Component Value Date/Time   COLORURINE YELLOW 08/25/2019 1640   APPEARANCEUR CLOUDY (A) 08/25/2019 1640   LABSPEC 1.014 08/25/2019 1640   PHURINE 6.0 08/25/2019 1640   GLUCOSEU 50 (A) 08/25/2019 1640   HGBUR NEGATIVE 08/25/2019 1640   BILIRUBINUR NEGATIVE 08/25/2019 1640   KETONESUR 5 (A) 08/25/2019 1640   PROTEINUR 100 (A) 08/25/2019 1640   NITRITE NEGATIVE 08/25/2019 1640   LEUKOCYTESUR LARGE (A) 08/25/2019 1640   Sepsis Labs: Invalid input(s): PROCALCITONIN, LACTICIDVEN  Recent Results (from the past 240 hour(s))  Culture, Urine     Status: Abnormal   Collection Time: 08/26/19 11:34 AM   Specimen: Urine, Random  Result Value Ref Range Status   Specimen Description   Final    URINE, RANDOM Performed at [facility], [address], [place], [place]    Special Requests   Final     NONE Performed at [facility], [address] [place], [place]    Culture 80,000 COLONIES/mL YEAST (A)  Final   Report Status 08/27/2019 FINAL  Final      Radiology Studies: CT ABDOMEN PELVIS WO CONTRAST  Result Date: 08/26/2019 CLINICAL DATA:  Bowel obstruction suspected EXAM: CT ABDOMEN AND PELVIS WITHOUT CONTRAST TECHNIQUE: Multidetector CT imaging of the abdomen and pelvis was performed following the standard protocol without IV contrast. COMPARISON:  Radiograph 08/24/2019 FINDINGS: Lower chest: Small pleural effusions. Ground-glass and airspace opacities in the lung bases, mostly dependent. Hepatobiliary: Negative, with some motion degradation. Pancreas: Unremarkable. No pancreatic ductal dilatation or surrounding inflammatory changes. Spleen:  Normal in size without focal abnormality. Adrenals/Urinary Tract: Normal adrenal glands. Linear calcifications in the left renal hilum may be vascular versus calculi. No hydronephrosis. Urinary bladder physiologically distended, mildly thick-walled. Stomach/Bowel: Stomach is incompletely distended. Multiple gas and fluid distended proximal and mid small bowel loops. Distal small bowel loops appear decompressed. There is no discrete transition point. Appendix unremarkable. The colon is nondilated. Fecal distention of the rectum. Vascular/Lymphatic: Extensive aortoiliac atherosclerosis (ICD10-170.0) without aneurysm. No abdominal or pelvic adenopathy. Reproductive: Prostate is unremarkable. Other: No ascites. No free air. There are streaky infiltrative/edematous/inflammatory changes in the retroperitoneum extending into the pelvis, most conspicuous overlying the left psoas below the aortic bifurcation. No well-defined drainable fluid collection, mass, or evident etiology. Musculoskeletal: Lower lumbar spondylitic changes. No fracture or worrisome bone lesion. Bilateral hip DJD. IMPRESSION: 1. Nonspecific inflammatory/edematous  changes in the retroperitoneum. No imaging evidence of common potential etiologies including pancreatitis, peptic ulcer disease, obstructing urolithiasis, or aortic aneurysm. Correlate clinically. 2. Small bilateral pleural effusions. Electronically Signed   By: Lucrezia Europe M.D.   On: 08/26/2019 11:13   DG Abd 1 View  Result Date: 08/27/2019 CLINICAL DATA:  Ileus EXAM: ABDOMEN - 1 VIEW COMPARISON:  08/26/2019 CT abdomen/pelvis FINDINGS: No disproportionately dilated small bowel loops. Mild colonic gas appears decreased in the interval. No evidence of pneumatosis or pneumoperitoneum. No radiopaque nephrolithiasis. Clear lung bases. IMPRESSION: Nonobstructive bowel gas pattern.  Colonic gas appears decreased. Electronically Signed   By: Ilona Sorrel M.D.   On: 08/27/2019 09:38   Marzetta Board, MD, PhD [facility]  Between 7 am - 7 pm I am available, please contact me via Amion or Securechat  Between 7 pm - 7 am I am not available, please contact night coverage MD/APP via Amion

## 2019-08-28 NOTE — Progress Notes (Signed)
The patient is alert and verbal, and is blind. Denies pain or discomfort. The patient is an inmate and has 2 guards outside of his door. The patient slept throughout the night.

## 2019-08-28 NOTE — Progress Notes (Signed)
MEDICATION RELATED CONSULT NOTE - Follow-Up   Pharmacy Consult for Dilantin Indication: History of seizures  No Known Allergies  Patient Measurements: Height: 5\' 11"  (180.3 cm) Weight: 184 lb (83.5 kg) IBW/kg (Calculated) : 75.3 Adjusted Body Weight: 78.6 kg  Labs: Recent Labs    08/26/19 0549 08/27/19 0200 08/28/19 0130  WBC 6.2 5.7 6.5  HGB 7.9* 7.5* 7.4*  HCT 25.5* 24.5* 24.7*  PLT 342 366 396  CREATININE 2.79* 2.67* 2.42*  MG 2.1 2.0 1.9  PHOS 1.4* 2.2* 2.8  ALBUMIN 2.4* 2.4* 2.3*  PROT 5.9* 5.4* 5.3*  AST 55* 54* 45*  ALT 17 16 14   ALKPHOS 107 114 128*  BILITOT 0.3 0.3 0.4    Estimated Creatinine Clearance: 33.3 mL/min (A) (by C-G formula based on SCr of 2.42 mg/dL (H)).  Assessment: Pt was tx from [facility] earlier today for COVID. He has been on phenytoin outpt for seizure with a relatively high dose with levels noted to be supratherapeutic on admission. Phenytoin is now held and trending levels for plans to resume.   The patient's corrected phenytoin level today remains SUPRAtherapeutic (measured 19.3, alb 2.3 >> corrects to 34.5). Also DDI with fluconazole which decreases phenytoin metabolism thereby increasing concentrations. Will continue to hold doses and upon resuming will start at a lower dose.   Goal of Therapy:  Corrected Phenytoin level of 10-20 mcg/ml  Plan:  Continue to hold phenytoin Trend daily phenytoin levels + alb for correction Plan to resume at lower dose as able  Thank you for involving pharmacy in this patient's care.  08/30/19, PharmD, BCPS, BCCCP Clinical Pharmacist Please refer to [facility] for [facility] Pharmacy numbers 08/28/2019 9:29 AM  **Pharmacist phone directory can be found on [email or web address] listed under [facility]**

## 2019-08-29 LAB — CBC WITH DIFFERENTIAL/PLATELET
Abs Immature Granulocytes: 0.05 10*3/uL (ref 0.00–0.07)
Basophils Absolute: 0 10*3/uL (ref 0.0–0.1)
Basophils Relative: 0 %
Eosinophils Absolute: 0.1 10*3/uL (ref 0.0–0.5)
Eosinophils Relative: 2 %
HCT: 30.5 % — ABNORMAL LOW (ref 39.0–52.0)
Hemoglobin: 9.1 g/dL — ABNORMAL LOW (ref 13.0–17.0)
Immature Granulocytes: 1 %
Lymphocytes Relative: 15 %
Lymphs Abs: 1.2 10*3/uL (ref 0.7–4.0)
MCH: 21.9 pg — ABNORMAL LOW (ref 26.0–34.0)
MCHC: 29.8 g/dL — ABNORMAL LOW (ref 30.0–36.0)
MCV: 73.5 fL — ABNORMAL LOW (ref 80.0–100.0)
Monocytes Absolute: 0.8 10*3/uL (ref 0.1–1.0)
Monocytes Relative: 10 %
Neutro Abs: 6 10*3/uL (ref 1.7–7.7)
Neutrophils Relative %: 72 %
Platelets: 420 10*3/uL — ABNORMAL HIGH (ref 150–400)
RBC: 4.15 MIL/uL — ABNORMAL LOW (ref 4.22–5.81)
RDW: 20.8 % — ABNORMAL HIGH (ref 11.5–15.5)
WBC: 8.2 10*3/uL (ref 4.0–10.5)
nRBC: 0 % (ref 0.0–0.2)

## 2019-08-29 LAB — D-DIMER, QUANTITATIVE: D-Dimer, Quant: 1.79 ug/mL-FEU — ABNORMAL HIGH (ref 0.00–0.50)

## 2019-08-29 LAB — COMPREHENSIVE METABOLIC PANEL
ALT: 15 U/L (ref 0–44)
AST: 38 U/L (ref 15–41)
Albumin: 2.4 g/dL — ABNORMAL LOW (ref 3.5–5.0)
Alkaline Phosphatase: 153 U/L — ABNORMAL HIGH (ref 38–126)
Anion gap: 9 (ref 5–15)
BUN: 23 mg/dL (ref 8–23)
CO2: 19 mmol/L — ABNORMAL LOW (ref 22–32)
Calcium: 8.2 mg/dL — ABNORMAL LOW (ref 8.9–10.3)
Chloride: 115 mmol/L — ABNORMAL HIGH (ref 98–111)
Creatinine, Ser: 2.17 mg/dL — ABNORMAL HIGH (ref 0.61–1.24)
GFR calc Af Amer: 36 mL/min — ABNORMAL LOW (ref >60)
GFR calc non Af Amer: 31 mL/min — ABNORMAL LOW (ref >60)
Glucose, Bld: 65 mg/dL — ABNORMAL LOW (ref 70–99)
Potassium: 4.4 mmol/L (ref 3.5–5.1)
Sodium: 143 mmol/L (ref 135–145)
Total Bilirubin: 0.2 mg/dL — ABNORMAL LOW (ref 0.3–1.2)
Total Protein: 5.9 g/dL — ABNORMAL LOW (ref 6.5–8.1)

## 2019-08-29 LAB — GLUCOSE, CAPILLARY
Glucose-Capillary: 140 mg/dL — ABNORMAL HIGH (ref 70–99)
Glucose-Capillary: 93 mg/dL (ref 70–99)
Glucose-Capillary: 120 mg/dL — ABNORMAL HIGH (ref 70–99)

## 2019-08-29 LAB — PHENYTOIN LEVEL, FREE AND TOTAL
Phenytoin, Free: 4.8 ug/mL — ABNORMAL HIGH (ref 1.0–2.0)
Phenytoin, Total: 19.6 ug/mL (ref 10.0–20.0)

## 2019-08-29 LAB — PHOSPHORUS: Phosphorus: 2.5 mg/dL (ref 2.5–4.6)

## 2019-08-29 LAB — MAGNESIUM: Magnesium: 1.8 mg/dL (ref 1.7–2.4)

## 2019-08-29 LAB — C-REACTIVE PROTEIN: CRP: 6.7 mg/dL — ABNORMAL HIGH (ref <1.0)

## 2019-08-29 LAB — PHENYTOIN LEVEL, TOTAL: Phenytoin Lvl: 16.1 ug/mL (ref 10.0–20.0)

## 2019-08-29 MED ORDER — FLUCONAZOLE 100 MG PO TABS: 100.0000 mg | ORAL_TABLET | Freq: Every day | ORAL | Status: AC

## 2019-08-29 MED ORDER — DEXAMETHASONE 6 MG PO TABS: 6.0000 mg | ORAL_TABLET | Freq: Every day | ORAL | 0 refills | Status: AC

## 2019-08-29 MED ORDER — PHENYTOIN 50 MG PO CHEW: 50.0000 mg | CHEWABLE_TABLET | Freq: Three times a day (TID) | ORAL | Status: AC

## 2019-08-29 NOTE — Discharge Summary (Signed)
Physician Discharge Summary  Ricky Olsen UUV:[id number] DOB: 11-Apr-1956 DOA: 08/24/2019  PCP: System, Pcp Not In  Admit date: 08/24/2019 Discharge date: 08/29/2019  Admitted From: [facility] Disposition:  [facility]  Recommendations for Outpatient Follow-up:  1. Please continue Diflucan for 3 additional days for a total of 7-day course 2. Please continue Decadron for 5 additional days for a total of 10-day course 3. Phenytoin dose was adjusted to 50 mg every 8 hours due to elevated phenytoin levels, please monitor levels and adjust accordingly  Discharge Condition: stable CODE STATUS: Full code Diet recommendation: regular  HPI: Per admitting MD, Ricky Olsen is a 64 y.o. male with medical history significant of type 2 diabetes mellitus, hypertension, apparently had a CVA in 2017 complicated by anoxic encephalopathy and seizure disorder, history of CAD with NSTEMI, history of V. fib, EF 50-55% in 2017, legally blind, who is a current resident of the [facility] [place] and in 2017, I presume following CVA he has been living in the inpatient service hospital requiring total care.  In November 2020 due to increased Covid cases in the inpatient service hospital he was transferred for SNF type care to [facility] and has been there since.  His to stay over there is pertinent for anemia requiring a blood transfusion at the end of December, no frank bleeding was noted but his fecal occult was positive.  GI evaluated patient and apparently underwent an EGD on 07/11/2019 without source of bleeding as well as a colonoscopy on 07/13/2019 with no source of bleeding, no polyps, no diverticuli or AV malformations.  Notes do mention at one point to consider capsule study but I do not see a report that this has been done by this time.  On 2/10 patient had a febrile episode and appeared to be septic, he was pancultured and initially started on broad-spectrum antibiotics with vancomycin  and meropenem.  He ended up speciate in ESBL Morganella and has been maintained on meropenem with plans in place to be done on 2/20.  On 2/16 patient had an episode of nausea and vomiting and an x-ray of the abdomen showed an ileus.  He was made n.p.o. at that time and placed on IV fluids.  Over the last few days given persistent fever of 101 he was tested for Covid on 2/16 and this returned positive.  Patient was also noted to be hypoxic on room air and required 2 L nasal cannula, and at that point Dr. Eliezer Champagne asked to transfer patient to [facility] for evaluation for remdesivir. Given a degree of chronic anoxic encephalopathy patient's baseline is relatively poor, he is bedbound and alert to self and total care.  On my evaluation he can tell me his full name but does not know where he is does not know the time or why he is here.  He currently has no complaints for me, specifically says no to chest pain, abdominal pain, nausea, vomiting, shortness of breath. His labs prior to transfer showed a hemoglobin of 8.6 (close to his recent baseline), creatinine of 2.1 with prior value at 1.2, LFTs unremarkable and a WBC of 5.6  Hospital Course / Discharge diagnoses: Acute hypoxic respiratory failure due to COVID-19 viral illness-patient was admitted to the hospital with hypoxia requiring 2 L nasal cannula.  Shortly after admission he was able to be weaned off to room air and has remained on room air since.  He completed 5 days of remdesivir while hospitalized.  He was also started on steroids with Decadron 6 mg daily, will need that for total of 10 days with 5 additional days remaining at the time of discharge.  Chest x-ray done on admission showed faint bibasilar opacities, possibly atelectasis versus infiltrates.  His inflammatory markers have been improving  COVID-19 Labs  Recent Labs    08/27/19 0200 08/28/19 0130 08/29/19 0145  DDIMER 1.89* 1.95* 1.79*  CRP 11.7* 9.6* 6.7*   Acute kidney injury  -creatinine was previously 1.2 mid February, raised to 2.11 left [facility] and here increased up to 2.7.  This is likely multifactorial in the setting of ACE inhibitor's, GI losses with vomiting due to ileus prior to admission as well as poor p.o. intake.  He received IV fluids, his creatinine is now improving, 2.1 on discharge from our hospital, recommend holding ACE inhibitor's as well as Metformin up until creatinine normalizes.  He underwent a renal ultrasound which did not show any hydronephrosis and was fairly unremarkable.  Bladder scan did not show any significant retention. Ileus, prior to admission -Patient had an episode of vomiting on 2/16, was diagnosed with an ileus at that time.  Abdominal x-ray on admission showed air-filled loops of large and small bowel suggesting ileus, possibly multifactorial due to underlying chronic anoxic brain injury which can predispose to this as well as supratherapeutic phenytoin levels as its toxicity can occasionally manifest as ileus.  He was asymptomatic from this without nausea or vomiting while hospitalized.  Underwent a CT scan of the abdomen and pelvis which showed nonspecific retroperitoneal inflammation of unclear etiology, however it did also show large stool burden.  He underwent aggressive bowel regimen with oral agents as well as enema with excellent results, repeat abdominal x-ray showed improvement in ileus, his diet was advanced and he is now able to tolerate a regular diet.  Please continue aggressive bowel regimen Essential hypertension -his lisinopril has been held, his blood pressure has remained acceptable, continue metoprolol and Imdur Morganella ESBL UTI -Diagnosed prior to admission, completed a course of 10-day meropenem on 2/20 Yeast UTI -Urinalysis here shows yeast, started Diflucan, continue for total of 7 days with first day 2/19 Hypomagnesemia/hypophosphatemia -repleted as indicated Seizure disorder with supratherapeutic phenytoin levels    -Medication regimen transferred showed phenytoin 150 mg every 8 hours, his phenytoin levels were quite elevated, pharmacy followed, will decrease dose to 50 every 8 hours, please follow phenytoin levels per pharmacy History of CVA -I do not see him being on aspirin, perhaps this was held when there was concern for GI bleed.  Defer to outpatient Type 2 diabetes mellitus -resume home regimen, hold Metformin while creatinine still up Iron deficiency anemia versus blood loss anemia -There was concern for blood loss anemia from GI source given positive fecal occult and required a blood transfusion in December, however EGD and colonoscopy work-up in January 2021 were negative. There were mentions about a capsule study but that does not appear to have been done.  Hemoglobin here trended down without evidence of bleeding, possibly dilutional due to the fact that he received fluids, regardless he was transfused unit of packed red blood cells on 2/22. Continue iron supplementations Anoxic brain injury/intermittent psychosis -Per notes, continue Zyprexa nightly Coronary artery disease -Continue Imdur   Discharge Instructions   Allergies as of 08/29/2019   No Known Allergies     Medication List    STOP taking these medications   dexamethasone 10 MG/ML injection Commonly known as: DECADRON   lisinopril 5  MG tablet Commonly known as: ZESTRIL   meropenem 1 g in sodium chloride 0.9 % 100 mL   metFORMIN 1000 MG tablet Commonly known as: GLUCOPHAGE   phenytoin 100 MG ER capsule Commonly known as: DILANTIN     TAKE these medications   acetaminophen 650 MG CR tablet Commonly known as: TYLENOL Take 650 mg by mouth every 6 (six) hours as needed for pain.   bisacodyl 10 MG suppository Commonly known as: DULCOLAX Place 10 mg rectally daily as needed for moderate constipation.   dexamethasone 6 MG tablet Commonly known as: DECADRON Take 1 tablet (6 mg total) by mouth daily.   enoxaparin 30  MG/0.3ML injection Commonly known as: LOVENOX Inject 30 mg into the skin daily.   ferrous sulfate 325 (65 FE) MG tablet Take 325 mg by mouth daily with breakfast.   fluconazole 100 MG tablet Commonly known as: DIFLUCAN Take 1 tablet (100 mg total) by mouth daily. Start taking on: August 30, 2019   insulin lispro 100 UNIT/ML injection Commonly known as: HUMALOG Inject 0-5 Units into the skin 3 (three) times daily before meals. Sliding Scale Insulin 0-150=0 units, 151-200=1 unit, 201-250=2 units, 251-300=3 units, 301-350=4 units, 351-400=5 units, greater than 400 call MD   isosorbide dinitrate 10 MG tablet Commonly known as: ISORDIL Take 10 mg by mouth 3 (three) times daily.   Melatonin 3 MG Tabs Take 6 mg by mouth at bedtime as needed (sleep).   metoprolol tartrate 25 MG tablet Commonly known as: LOPRESSOR Take 25 mg by mouth 2 (two) times daily.   multivitamin with minerals Tabs tablet Take 1 tablet by mouth daily.   OLANZapine 5 MG tablet Commonly known as: ZYPREXA Take 5 mg by mouth at bedtime.   pantoprazole 40 MG tablet Commonly known as: PROTONIX Take 40 mg by mouth daily.   phenytoin 50 MG tablet Commonly known as: DILANTIN Chew 1 tablet (50 mg total) by mouth 3 (three) times daily. 100mg +50mg =150 mg 3 times daily   polyethylene glycol 17 g packet Commonly known as: MIRALAX / GLYCOLAX Take 17 g by mouth daily as needed (constipation.).   senna 8.6 MG Tabs tablet Commonly known as: SENOKOT Take 2 tablets by mouth daily as needed for mild constipation. HOLD FOR LOOSE STOOLS   simethicone 80 MG chewable tablet Commonly known as: MYLICON Chew 80 mg by mouth every 6 (six) hours as needed (gas/indigestion/heartburn).       Consultations:  None   Procedures/Studies:  CT ABDOMEN PELVIS WO CONTRAST  Result Date: 08/26/2019 CLINICAL DATA:  Bowel obstruction suspected EXAM: CT ABDOMEN AND PELVIS WITHOUT CONTRAST TECHNIQUE: Multidetector CT imaging of the  abdomen and pelvis was performed following the standard protocol without IV contrast. COMPARISON:  Radiograph 08/24/2019 FINDINGS: Lower chest: Small pleural effusions. Ground-glass and airspace opacities in the lung bases, mostly dependent. Hepatobiliary: Negative, with some motion degradation. Pancreas: Unremarkable. No pancreatic ductal dilatation or surrounding inflammatory changes. Spleen: Normal in size without focal abnormality. Adrenals/Urinary Tract: Normal adrenal glands. Linear calcifications in the left renal hilum may be vascular versus calculi. No hydronephrosis. Urinary bladder physiologically distended, mildly thick-walled. Stomach/Bowel: Stomach is incompletely distended. Multiple gas and fluid distended proximal and mid small bowel loops. Distal small bowel loops appear decompressed. There is no discrete transition point. Appendix unremarkable. The colon is nondilated. Fecal distention of the rectum. Vascular/Lymphatic: Extensive aortoiliac atherosclerosis (ICD10-170.0) without aneurysm. No abdominal or pelvic adenopathy. Reproductive: Prostate is unremarkable. Other: No ascites. No free air. There are streaky infiltrative/edematous/inflammatory changes  in the retroperitoneum extending into the pelvis, most conspicuous overlying the left psoas below the aortic bifurcation. No well-defined drainable fluid collection, mass, or evident etiology. Musculoskeletal: Lower lumbar spondylitic changes. No fracture or worrisome bone lesion. Bilateral hip DJD. IMPRESSION: 1. Nonspecific inflammatory/edematous changes in the retroperitoneum. No imaging evidence of common potential etiologies including pancreatitis, peptic ulcer disease, obstructing urolithiasis, or aortic aneurysm. Correlate clinically. 2. Small bilateral pleural effusions. Electronically Signed   By: Corlis Leak M.D.   On: 08/26/2019 11:13   DG Abd 1 View  Result Date: 08/27/2019 CLINICAL DATA:  Ileus EXAM: ABDOMEN - 1 VIEW COMPARISON:   08/26/2019 CT abdomen/pelvis FINDINGS: No disproportionately dilated small bowel loops. Mild colonic gas appears decreased in the interval. No evidence of pneumatosis or pneumoperitoneum. No radiopaque nephrolithiasis. Clear lung bases. IMPRESSION: Nonobstructive bowel gas pattern.  Colonic gas appears decreased. Electronically Signed   By: Delbert Phenix M.D.   On: 08/27/2019 09:38   DG Abd 1 View  Result Date: 08/24/2019 CLINICAL DATA:  Limited mobility. EXAM: ABDOMEN - 1 VIEW COMPARISON:  None. FINDINGS: Multiple air-filled loops of large and small bowel are identified. Findings suggest ileus. No convincing evidence of obstruction. No free air, portal venous gas, or pneumatosis. IMPRESSION: Multiple prominent air-filled loops of large and small bowel suggesting the possibility of ileus. No evidence of obstruction. Electronically Signed   By: Gerome Sam III M.D   On: 08/24/2019 19:47   US RENAL  Result Date: 08/25/2019 CLINICAL DATA:  Acute renal injury EXAM: RENAL / URINARY TRACT ULTRASOUND COMPLETE COMPARISON:  None. FINDINGS: Right Kidney: Renal measurements: 12.1 x 6.0 x 6.1 cm = volume: 233 mL . Echogenicity within normal limits. No mass or hydronephrosis visualized. Left Kidney: Renal measurements: 13.1 x 6.8 x 7.1 cm = volume: 327 mL. Echogenicity within normal limits. No mass or hydronephrosis visualized. Bladder: Appears normal for degree of bladder distention. Other: None. IMPRESSION: Normal renal ultrasound.  No hydronephrosis. Electronically Signed   By: Genevive Bi M.D.   On: 08/25/2019 15:38   Portable chest 1 View  Result Date: 08/24/2019 CLINICAL DATA:  COVID-19 EXAM: PORTABLE CHEST 1 VIEW COMPARISON:  04/20/2019 FINDINGS: Shallow lung inflation with faint bibasilar opacity, likely atelectasis. No pleural effusion or pneumothorax. Normal cardiomediastinal contours. IMPRESSION: Shallow lung inflation with faint bibasilar opacities, likely atelectasis. Electronically Signed   By:  Deatra Robinson M.D.   On: 08/24/2019 19:16      Subjective: - no chest pain, shortness of breath, no abdominal pain, nausea or vomiting.   Discharge Exam: BP 120/82 (BP Location: Right Arm)   Pulse 77   Temp (!) 97.3 F (36.3 C) (Oral)   Resp 20   Ht 5\' 11"  (1.803 m)   Wt 83.5 kg   SpO2 98%   BMI 25.66 kg/m   General: Pt is alert, awake, not in acute distress Cardiovascular: RRR, S1/S2 +, no rubs, no gallops Respiratory: CTA bilaterally, no wheezing, no rhonchi Abdominal: Soft, NT, ND, bowel sounds + Extremities: no edema, no cyanosis   The results of significant diagnostics from this hospitalization (including imaging, microbiology, ancillary and laboratory) are listed below for reference.     Microbiology: Recent Results (from the past 240 hour(s))  Culture, Urine     Status: Abnormal   Collection Time: 08/26/19 11:34 AM   Specimen: Urine, Random  Result Value Ref Range Status   Specimen Description   Final    URINE, RANDOM Performed at [facility], [address] [place], [place] [address]    Special Requests   Final    NONE Performed at [facility], [address] [place], [place] [address]    Culture 80,000 COLONIES/mL YEAST (A)  Final   Report Status 08/27/2019 FINAL  Final     Labs: Basic Metabolic Panel: Recent Labs  Lab 08/25/19 0157 08/26/19 0549 08/27/19 0200 08/28/19 0130 08/29/19 0145  NA 137 137 140 140 143  K 4.6 4.0 4.1 3.9 4.4  CL 105 105 110 112* 115*  CO2 21* 22 20* 19* 19*  GLUCOSE 100* 100* 80 91 65*  BUN 31* 35* 31* 26* 23  CREATININE 2.50* 2.79* 2.67* 2.42* 2.17*  CALCIUM 8.4* 8.1* 7.9* 8.0* 8.2*  MG 1.6* 2.1 2.0 1.9 1.8  PHOS 2.9 1.4* 2.2* 2.8 2.5   Liver Function Tests: Recent Labs  Lab 08/25/19 0157 08/26/19 0549 08/27/19 0200 08/28/19 0130 08/29/19 0145  AST 58* 55* 54* 45* 38  ALT 19 17 16 14 15   ALKPHOS 105 107 114 128* 153*  BILITOT 0.7 0.3 0.3 0.4 0.2*  PROT 6.0*  5.9* 5.4* 5.3* 5.9*  ALBUMIN 2.5* 2.4* 2.4* 2.3* 2.4*   CBC: Recent Labs  Lab 08/25/19 0157 08/26/19 0549 08/27/19 0200 08/28/19 0130 08/29/19 0145  WBC 7.1 6.2 5.7 6.5 8.2  NEUTROABS 4.9 4.5 3.6 4.4 6.0  HGB 8.7* 7.9* 7.5* 7.4* 9.1*  HCT 28.7* 25.5* 24.5* 24.7* 30.5*  MCV 71.4* 68.7* 69.4* 70.4* 73.5*  PLT 296 342 366 396 420*   CBG: Recent Labs  Lab 08/28/19 0744 08/28/19 1129 08/28/19 1710 08/28/19 2038 08/29/19 0707  GLUCAP 92 144* 140* 139* 93   Hgb A1c No results for input(s): HGBA1C in the last 72 hours. Lipid Profile No results for input(s): CHOL, HDL, LDLCALC, TRIG, CHOLHDL, LDLDIRECT in the last 72 hours. Thyroid function studies No results for input(s): TSH, T4TOTAL, T3FREE, THYROIDAB in the last 72 hours.  Invalid input(s): FREET3 Urinalysis    Component Value Date/Time   COLORURINE YELLOW 08/25/2019 1640   APPEARANCEUR CLOUDY (A) 08/25/2019 1640   LABSPEC 1.014 08/25/2019 1640   PHURINE 6.0 08/25/2019 1640   GLUCOSEU 50 (A) 08/25/2019 1640   HGBUR NEGATIVE 08/25/2019 1640   BILIRUBINUR NEGATIVE 08/25/2019 1640   KETONESUR 5 (A) 08/25/2019 1640   PROTEINUR 100 (A) 08/25/2019 1640   NITRITE NEGATIVE 08/25/2019 1640   LEUKOCYTESUR LARGE (A) 08/25/2019 1640    FURTHER DISCHARGE INSTRUCTIONS:   Get Medicines reviewed and adjusted: Please take all your medications with you for your next visit with your Primary MD   Laboratory/radiological data: Please request your Primary MD to go over all hospital tests and procedure/radiological results at the follow up, please ask your Primary MD to get all Hospital records sent to his/her office.   In some cases, they will be blood work, cultures and biopsy results pending at the time of your discharge. Please request that your primary care M.D. goes through all the records of your hospital data and follows up on these results.   Also Note the following: If you experience worsening of your admission symptoms,  develop shortness of breath, life threatening emergency, suicidal or homicidal thoughts you must seek medical attention immediately by calling 911 or calling your MD immediately  if symptoms less severe.   You must read complete instructions/literature along with all the possible adverse reactions/side effects for all the Medicines you take and that have been prescribed to you. Take any new Medicines after you have  completely understood and accpet all the possible adverse reactions/side effects.    Do not drive when taking Pain medications or sleeping medications (Benzodaizepines)   Do not take more than prescribed Pain, Sleep and Anxiety Medications. It is not advisable to combine anxiety,sleep and pain medications without talking with your primary care practitioner   Special Instructions: If you have smoked or chewed Tobacco  in the last 2 yrs please stop smoking, stop any regular Alcohol  and or any Recreational drug use.   Wear Seat belts while driving.   Please note: You were cared for by a hospitalist during your hospital stay. Once you are discharged, your primary care physician will handle any further medical issues. Please note that NO REFILLS for any discharge medications will be authorized once you are discharged, as it is imperative that you return to your primary care physician (or establish a relationship with a primary care physician if you do not have one) for your post hospital discharge needs so that they can reassess your need for medications and monitor your lab values.  Time coordinating discharge: 40 minutes  SIGNED:  Marzetta Board, MD, PhD 08/29/2019, 10:53 AM

## 2019-08-29 NOTE — TOC Transition Note (Signed)
Transition of Care [facility]) - CM/SW Discharge Note   Patient Details  Name: Ricky Olsen MRN: [id number] Date of Birth: 1956-06-27  [facility] CM/SW Contact:  Truddie Hidden, LCSW Phone Number: 08/29/2019, 12:30 PM   Clinical Narrative:    Discharged back to [facility]. Return coordinated with Lauren. Patient's legal guardian Patsy Lager [phone] aware and agreeable to this plan. Number to call report room #325 [phone] accepting MD Eliezer Champagne [phone] given to unit RN Norda. DC summary faxed to [facility] per her request at [phone]. Alice, CM at [facility] [phone] aware and agreeable to plan as well. [facility] will be providing transport with [facility] guards accompanying them. No other needs at this time. Case closed to this CSW.    Final next level of care: Long Term Acute Care (LTAC) Barriers to Discharge: Barriers Resolved   Patient Goals and CMS Choice        Discharge Placement              Patient chooses bed at: Other - please specify in the comment section below:([facility]) Patient to be transferred to facility by: [facility] Name of family member notified: Debby Freiberg Patient and family notified of of transfer: 08/29/19  Discharge Plan and Services                                     Social Determinants of Health (SDOH) Interventions     Readmission Risk Interventions No flowsheet data found.

## 2019-08-29 NOTE — Social Work (Signed)
CSW received call from Charge RN that MD wanted the patient's discharge summary faxed to his legal guardian, Debby Freiberg. RN provided fax number, and CSW faxed discharge summary.  Blenda Nicely, [place] Clinical Social Worker [phone]

## 2019-08-29 NOTE — Progress Notes (Signed)
MEDICATION RELATED CONSULT NOTE - Follow-Up   Pharmacy Consult for Dilantin Indication: History of seizures  No Known Allergies  Patient Measurements: Height: 5\' 11"  (180.3 cm) Weight: 184 lb (83.5 kg) IBW/kg (Calculated) : 75.3 Adjusted Body Weight: 78.6 kg  Labs: Recent Labs    08/27/19 0200 08/28/19 0130 08/29/19 0145  WBC 5.7 6.5 8.2  HGB 7.5* 7.4* 9.1*  HCT 24.5* 24.7* 30.5*  PLT 366 396 420*  CREATININE 2.67* 2.42* 2.17*  MG 2.0 1.9 1.8  PHOS 2.2* 2.8 2.5  ALBUMIN 2.4* 2.3* 2.4*  PROT 5.4* 5.3* 5.9*  AST 54* 45* 38  ALT 16 14 15   ALKPHOS 114 128* 153*  BILITOT 0.3 0.4 0.2*    Estimated Creatinine Clearance: 37.1 mL/min (A) (by C-G formula based on SCr of 2.17 mg/dL (H)).  Assessment: Pt was tx from [facility] earlier today for COVID. He has been on phenytoin outpt for seizure with a relatively high dose with levels noted to be supratherapeutic on admission. Phenytoin is now held and trending levels for plans to resume.   The patient's corrected phenytoin level today remains SUPRAtherapeutic (measured 16.1, alb 2.4 >> corrects to 27.8). Also DDI with fluconazole which decreases phenytoin metabolism thereby increasing concentrations. Will continue to hold doses and upon resuming will start at a lower dose.   Goal of Therapy:  Corrected Phenytoin level of 10-20 mcg/ml  Plan:  Continue to hold phenytoin Trend daily phenytoin levels + alb for correction Plan to resume at lower dose as able  Thank you for allowing pharmacy to be a part of this patient's care.  08/31/19, PharmD, BCPS Clinical Pharmacist 08/29/2019 9:40 AM   **Pharmacist phone directory can now be found on [email or web address] ([email or web address]).  Listed under [facility].

## 2019-09-01 LAB — TYPE AND SCREEN
ABO/RH(D): O POS
Antibody Screen: POSITIVE
Unit division: 0
Unit division: 0

## 2019-09-01 LAB — BPAM RBC
Blood Product Expiration Date: 202103222359
Blood Product Expiration Date: 202103222359
ISSUE DATE / TIME: 202102221356
Unit Type and Rh: 5100
Unit Type and Rh: 5100

## 2021-02-09 IMAGING — CT CT ABD-PELV W/O CM
1 of 2 series · 15 of 32 positions shown, 19 images · non-contrast
Comparison: Radiograph 08/24/2019

CLINICAL DATA: Bowel obstruction suspected

EXAM:
CT ABDOMEN AND PELVIS WITHOUT CONTRAST
TECHNIQUE: Multidetector CT imaging of the abdomen and pelvis was performed
following the standard protocol without IV contrast.

[Series 3: (person_name) thins · axial · 0.86mm/px · z∈[+465,+944]mm · 15 of 770 slices shown, 19 images]
[im 57/770  soft-tissue]
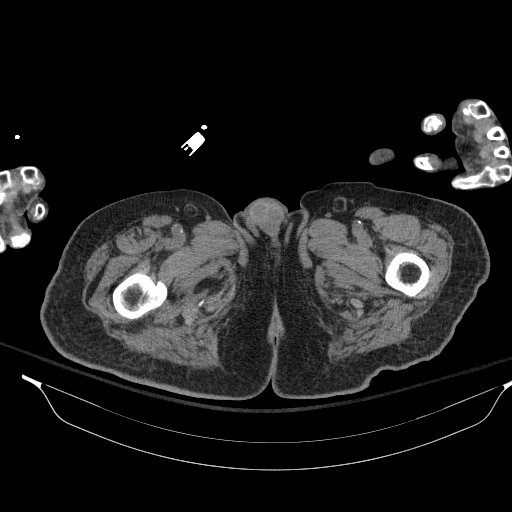
[im 57/770  bone]
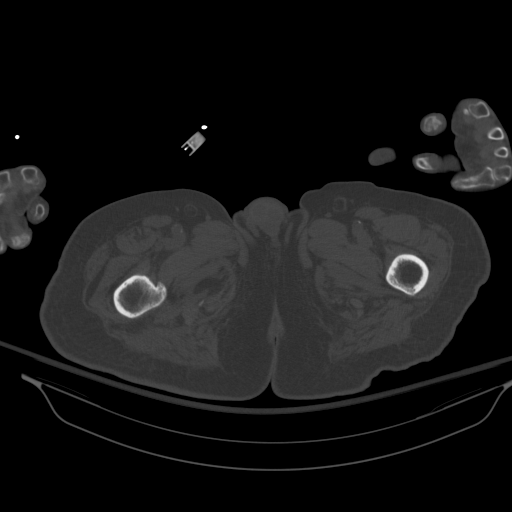
[im 114/770  soft-tissue]
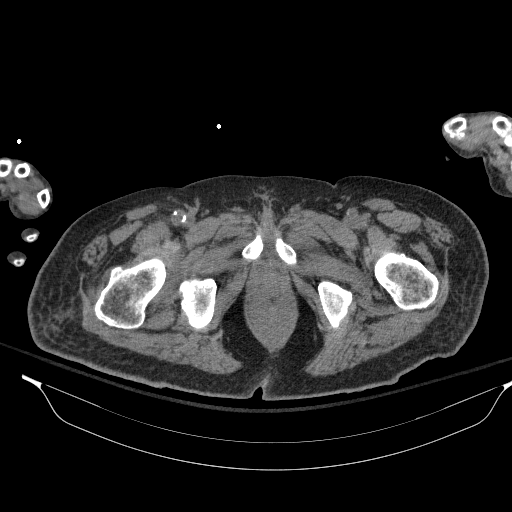
[im 171/770  soft-tissue]
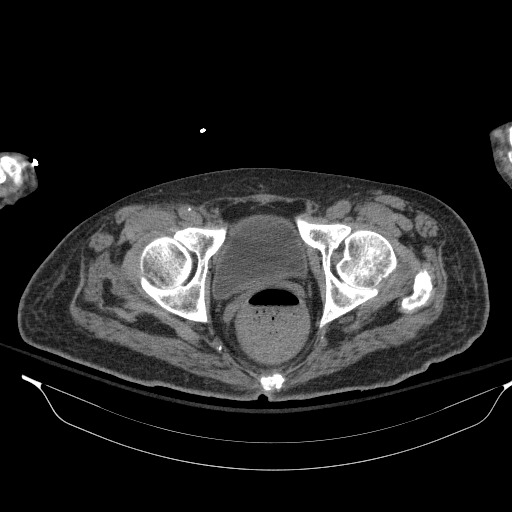
[im 228/770  soft-tissue]
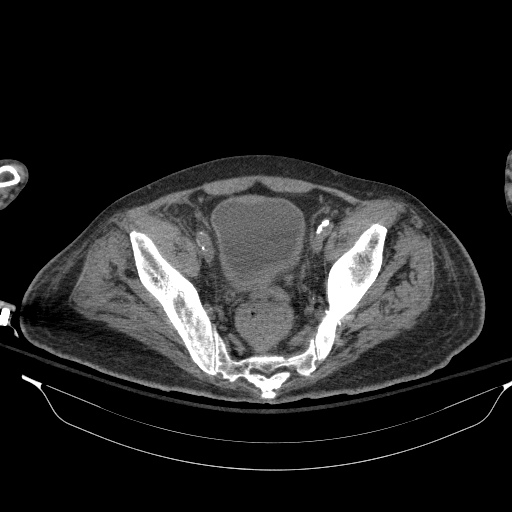
[im 285/770  soft-tissue]
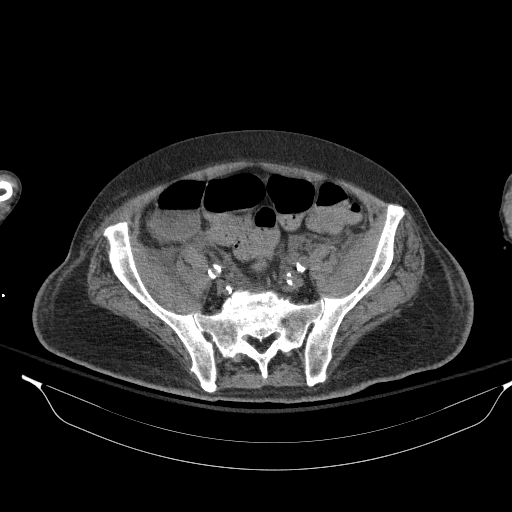
[im 342/770  soft-tissue]
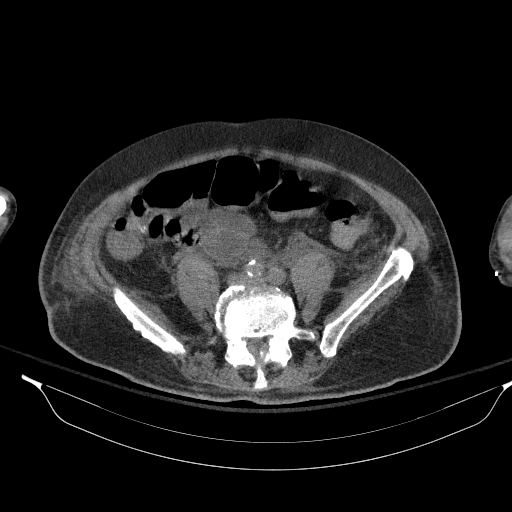
[im 399/770  soft-tissue]
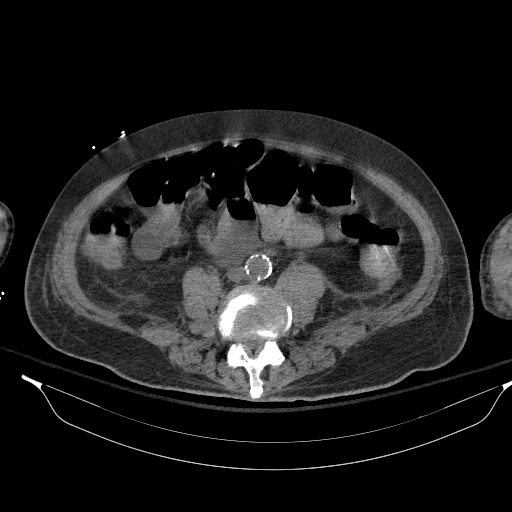
[im 456/770  soft-tissue]
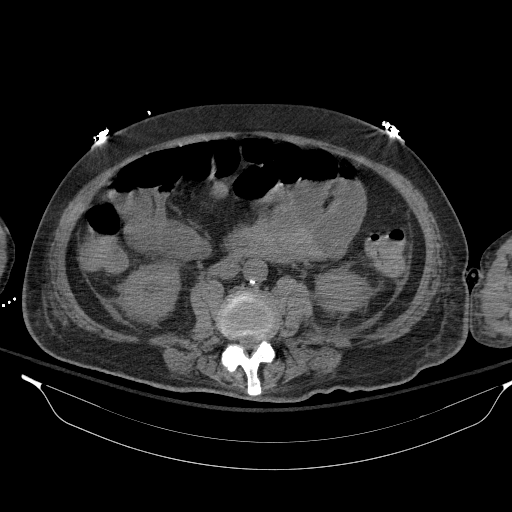
[im 513/770  soft-tissue]
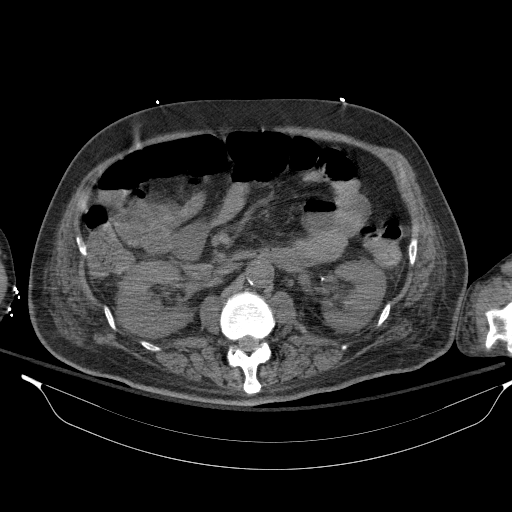
[im 513/770  bone]
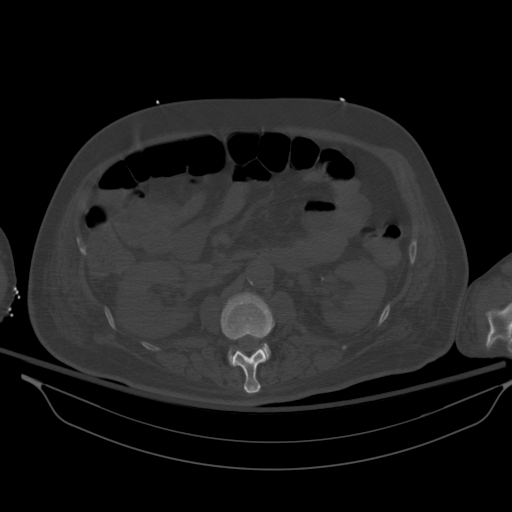
[im 570/770  soft-tissue]
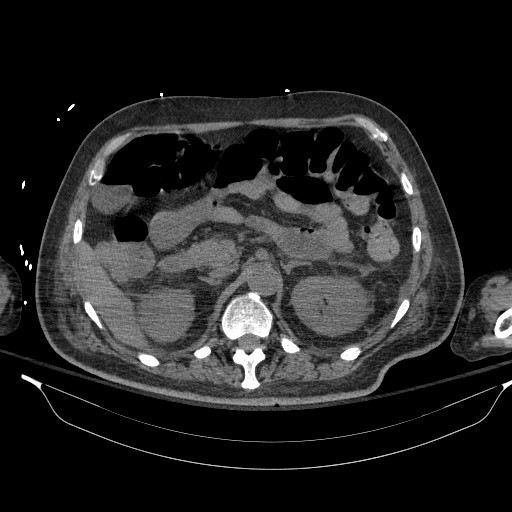
[im 627/770  soft-tissue]
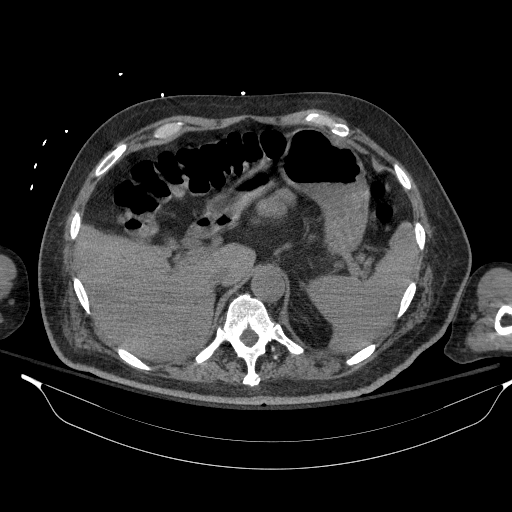
[im 656/770  lung]
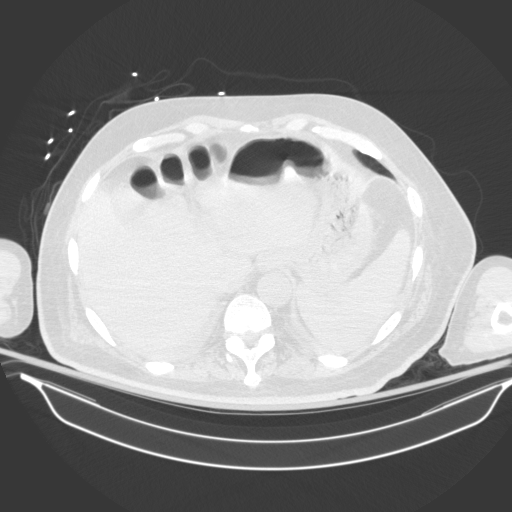
[im 684/770  soft-tissue]
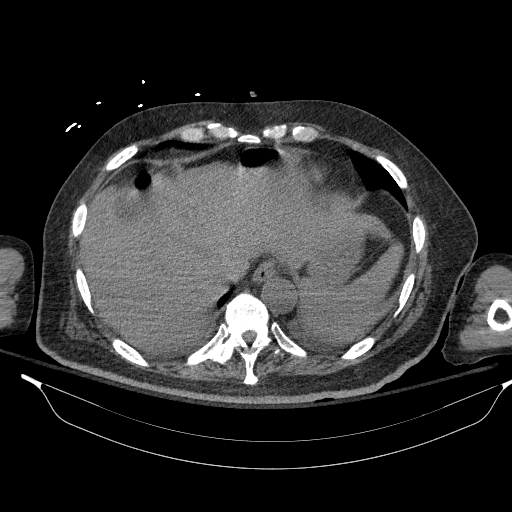
[im 684/770  lung]
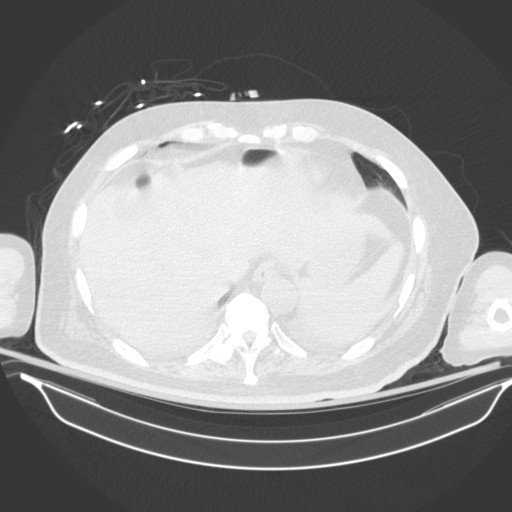
[im 713/770  lung]
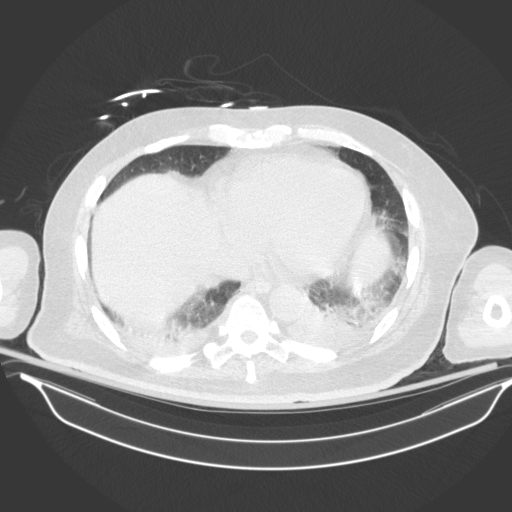
[im 741/770  soft-tissue]
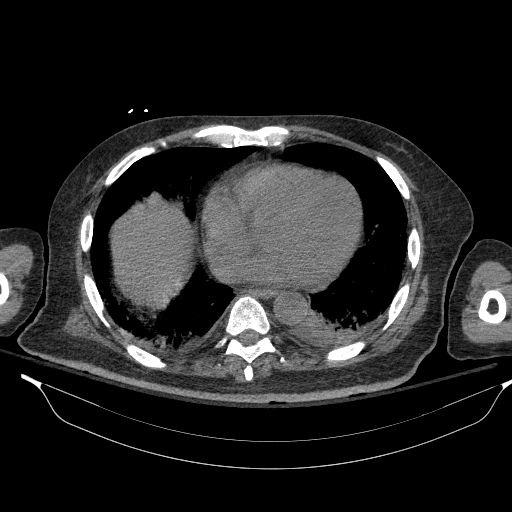
[im 741/770  lung]
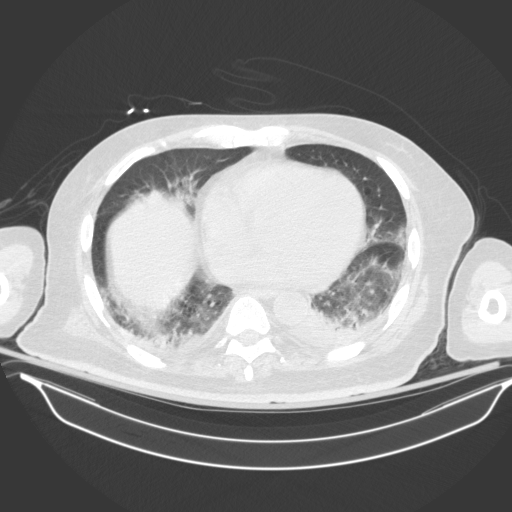

[15 of 32 positions shown; findings below may reference images not displayed]

FINDINGS: Lower chest: Small pleural effusions. Ground-glass and airspace
opacities in the lung bases, mostly dependent.

Hepatobiliary: Negative, with some motion degradation.

Pancreas: Unremarkable. No pancreatic ductal dilatation or
surrounding inflammatory changes.

Spleen: Normal in size without focal abnormality.

Adrenals/Urinary Tract: Normal adrenal glands. Linear calcifications
in the left renal hilum may be vascular versus calculi. No
hydronephrosis. Urinary bladder physiologically distended, mildly
thick-walled.

Stomach/Bowel: Stomach is incompletely distended. Multiple gas and
fluid distended proximal and mid small bowel loops. Distal small
bowel loops appear decompressed. There is no discrete transition
point.

Appendix unremarkable. The colon is nondilated. Fecal distention of
the rectum.

Vascular/Lymphatic: Extensive aortoiliac atherosclerosis
(VGAMH-170.0) without aneurysm. No abdominal or pelvic adenopathy.

Reproductive: Prostate is unremarkable.

Other: No ascites. No free air. There are streaky
infiltrative/edematous/inflammatory changes in the retroperitoneum
extending into the pelvis, most conspicuous overlying the left psoas
below the aortic bifurcation. No well-defined drainable fluid
collection, mass, or evident etiology.

Musculoskeletal: Lower lumbar spondylitic changes. No fracture or
worrisome bone lesion. Bilateral hip DJD.
IMPRESSION: 1. Nonspecific inflammatory/edematous changes in the
retroperitoneum. No imaging evidence of common potential etiologies
including pancreatitis, peptic ulcer disease, obstructing
urolithiasis, or aortic aneurysm. Correlate clinically.
2. Small bilateral pleural effusions.
